# Patient Record
Sex: Female | Born: 1980 | Race: Asian | Hispanic: No | Marital: Married | Smoking: Never smoker
Health system: Southern US, Community
[De-identification: ages and names within clinical notes are randomized; demographics above are authoritative.]

## PROBLEM LIST (undated history)

## (undated) DIAGNOSIS — D649 Anemia, unspecified: Secondary | ICD-10-CM

## (undated) DIAGNOSIS — R011 Cardiac murmur, unspecified: Secondary | ICD-10-CM

## (undated) DIAGNOSIS — O24419 Gestational diabetes mellitus in pregnancy, unspecified control: Secondary | ICD-10-CM

## (undated) HISTORY — DX: Anemia, unspecified: D64.9

## (undated) HISTORY — DX: Cardiac murmur, unspecified: R01.1

## (undated) HISTORY — DX: Gestational diabetes mellitus in pregnancy, unspecified control: O24.419

---

## 2012-11-09 HISTORY — PX: HYSTEROSCOPY: SHX211

## 2013-08-02 ENCOUNTER — Institutional Professional Consult (permissible substitution): Payer: Self-pay | Admitting: Cardiovascular Disease

## 2013-08-08 ENCOUNTER — Encounter: Payer: Self-pay | Admitting: Cardiovascular Disease

## 2016-05-07 ENCOUNTER — Ambulatory Visit (INDEPENDENT_AMBULATORY_CARE_PROVIDER_SITE_OTHER): Payer: BLUE CROSS/BLUE SHIELD | Admitting: Physician Assistant

## 2016-05-07 ENCOUNTER — Ambulatory Visit (INDEPENDENT_AMBULATORY_CARE_PROVIDER_SITE_OTHER): Payer: BLUE CROSS/BLUE SHIELD

## 2016-05-07 VITALS — BP 106/74 | HR 82 | Temp 98.2°F | Resp 16 | Ht 63.5 in | Wt 154.6 lb

## 2016-05-07 DIAGNOSIS — S6991XA Unspecified injury of right wrist, hand and finger(s), initial encounter: Secondary | ICD-10-CM

## 2016-05-07 DIAGNOSIS — S60011A Contusion of right thumb without damage to nail, initial encounter: Secondary | ICD-10-CM | POA: Diagnosis not present

## 2016-05-07 NOTE — Patient Instructions (Addendum)
Ibuprofen, ice, rest Wear splint for next 1 week. May take off at night if you want. If your symptoms are not improving in 1 week, return for repeat xray.    IF you received an x-ray today, you will receive an invoice from [facility]. Please contact [facility] at [phone] with questions or concerns regarding your invoice.   IF you received labwork today, you will receive an invoice from [facility]. Please contact [facility] at [phone] with questions or concerns regarding your invoice.   Our billing staff will not be able to assist you with questions regarding bills from these companies.  You will be contacted with the lab results as soon as they are available. The fastest way to get your results is to activate your My Chart account. Instructions are located on the last page of this paperwork. If you have not heard from us regarding the results in 2 weeks, please contact this office.

## 2016-05-07 NOTE — Progress Notes (Signed)
[facility] [address], [place] [address] [phone]  Date:  05/07/2016   Name:  Crystal Boyd   DOB:  07-22-1981   MRN:  [id number]  PCP:  No primary care provider on file.    Chief Complaint: Hand Injury   History of Present Illness:  This is a 35 y.o. female who is presenting with an injury to her right hand that occurred 1 day ago. She was walking her dog who is about 150 pounds. She had her right hand through the hand loop on the leash. Her dog wrapped the leash around a metal pole and the pole made a sound that scared him and he ran. This pulled her and her right thumb smashed into the pole. She is having some pinky soreness as well but not too bad. Thumb is swollen and bruised. Some tingling in her thumb. She wrapped her hand last night and applied ice. This morning when she woke still very bruised and swollen.  She is right hand dominant.  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  Review of Systems:  Review of Systems See HPI  There are no active problems to display for this patient.   Prior to Admission medications   Not on File    No Known Allergies  No past surgical history on file.  Social History  Substance Use Topics  . Smoking status: Never Smoker   . Smokeless tobacco: None  . Alcohol Use: No    Family History  Problem Relation Age of Onset  . Hypertension Mother   . Stroke Mother     Medication list has been reviewed and updated.  Physical Examination:  Physical Exam  Constitutional: She is oriented to person, place, and time. She appears well-developed and well-nourished. No distress.  HENT:  Head: Normocephalic and atraumatic.  Right Ear: Hearing normal.  Left Ear: Hearing normal.  Nose: Nose normal.  Eyes: Conjunctivae and lids are normal. Right eye exhibits no discharge. Left eye exhibits no discharge. No scleral icterus.  Pulmonary/Chest: Effort normal. No respiratory distress.  Musculoskeletal:       Right  wrist: Normal.       Left wrist: Normal.       Right hand: She exhibits decreased range of motion (decreased rom of thumb in all directions), tenderness (most tenderness over MCP joint), bony tenderness and swelling. She exhibits normal capillary refill. Normal sensation noted. Decreased strength (thumb) noted.       Left hand: Normal.  Mild tenderness over MCP 5th digit. No swelling or bruising. Full ROM and full strength.  Neurological: She is alert and oriented to person, place, and time.  Skin: Skin is warm, dry and intact.  Psychiatric: She has a normal mood and affect. Her speech is normal and behavior is normal. Thought content normal.    BP 106/74 mmHg  Pulse 82  Temp(Src) 98.2 F (36.8 C) (Oral)  Resp 16  Ht 5' 3.5" (1.613 m)  Wt 154 lb 9.6 oz (70.126 kg)  BMI 26.95 kg/m2  SpO2 100%  LMP 05/04/2016  Dg Finger Thumb Right  05/07/2016  CLINICAL DATA:  Thumb injury yesterday with swelling and bruising centered over the metacarpophalangeal joint EXAM: RIGHT THUMB 2+V COMPARISON:  None in PACs FINDINGS: The bones of the right thumb are adequately mineralized. There is no acute fracture nor dislocation. The joint spaces are preserved. There is mild soft tissue swelling centered over the first MCP joint. The first [facility]  joint is unremarkable. IMPRESSION: There is no acute bony abnormality of the right thumb. Electronically Signed   By: David  Swazil[place]Jordan M.D.   On: 05/07/2016 14:44    Assessment and Plan:  1. Thumb injury, right, initial encounter 2. Thumb contusion, right, initial encounter Radiograph negative. Placed in thumb spica splint for 1 week. Counseled on RICE therapy. Return in 1 week if symptoms not improving. - DG Finger Thumb Right; Future    Roswell MinersNicole V. Dyke BrackettBush, PA-C, MHS [facility]  05/07/2016

## 2016-08-26 ENCOUNTER — Other Ambulatory Visit (HOSPITAL_COMMUNITY): Payer: Self-pay | Admitting: Obstetrics

## 2016-08-26 DIAGNOSIS — Z3149 Encounter for other procreative investigation and testing: Secondary | ICD-10-CM

## 2016-08-31 ENCOUNTER — Ambulatory Visit (HOSPITAL_COMMUNITY)
Admission: RE | Admit: 2016-08-31 | Discharge: 2016-08-31 | Disposition: A | Discharge status: Home / Self Care | Payer: BLUE CROSS/BLUE SHIELD | Source: Ambulatory Visit | Attending: Obstetrics | Admitting: Obstetrics

## 2016-08-31 ENCOUNTER — Encounter (INDEPENDENT_AMBULATORY_CARE_PROVIDER_SITE_OTHER): Payer: Self-pay

## 2016-08-31 DIAGNOSIS — N979 Female infertility, unspecified: Secondary | ICD-10-CM | POA: Insufficient documentation

## 2016-08-31 DIAGNOSIS — Z3149 Encounter for other procreative investigation and testing: Secondary | ICD-10-CM

## 2016-08-31 MED ORDER — IOPAMIDOL (ISOVUE-300) INJECTION 61%
30.0000 mL | Freq: Once | INTRAVENOUS | Status: AC | PRN
  Administered 2016-08-31: 17 mL

## 2016-08-31 MED ORDER — IOPAMIDOL (ISOVUE-300) INJECTION 61%: 30.0000 mL | Freq: Once | INTRAVENOUS | Status: DC | PRN

## 2018-11-09 HISTORY — PX: POLYPECTOMY: SHX149

## 2019-11-01 ENCOUNTER — Other Ambulatory Visit: Payer: Self-pay | Admitting: Obstetrics

## 2021-05-27 ENCOUNTER — Other Ambulatory Visit: Payer: Self-pay

## 2021-05-27 ENCOUNTER — Ambulatory Visit (INDEPENDENT_AMBULATORY_CARE_PROVIDER_SITE_OTHER): Payer: Self-pay | Admitting: Family Medicine

## 2021-05-27 ENCOUNTER — Encounter: Payer: Self-pay | Admitting: Family Medicine

## 2021-05-27 VITALS — BP 114/75 | HR 79 | Wt 162.9 lb

## 2021-05-27 DIAGNOSIS — Z3200 Encounter for pregnancy test, result unknown: Secondary | ICD-10-CM

## 2021-05-27 DIAGNOSIS — Z3490 Encounter for supervision of normal pregnancy, unspecified, unspecified trimester: Secondary | ICD-10-CM

## 2021-05-27 LAB — POCT PREGNANCY, URINE: Preg Test, Ur: POSITIVE — AB

## 2021-05-27 MED ORDER — PRENATAL 6.75-0.2 MG PO TABS: 1.0000 | ORAL_TABLET | Freq: Every day | ORAL | 11 refills | Status: DC

## 2021-05-27 NOTE — Patient Instructions (Addendum)

## 2021-05-27 NOTE — Progress Notes (Signed)
  History:  [name] is a 40 y.o. [phone] who presents to clinic today with complaint of possible pregnancy.    Past Medical History:  Diagnosis Date   Anemia    Heart murmur     History reviewed. No pertinent surgical history.  The following portions of the patient's history were reviewed and updated as appropriate: allergies, current medications, past family history, past medical history, past social history, past surgical history and problem list.   Review of Systems:  Pertinent items noted in HPI and remainder of comprehensive ROS otherwise negative.  Objective:  Physical Exam BP 114/75   Pulse 79   Wt 162 lb 14.4 oz (73.9 kg)   LMP 04/01/2021   BMI 28.40 kg/m  Physical Exam Vitals reviewed.  Constitutional:      General: She is not in acute distress.    Appearance: She is well-developed. She is not diaphoretic.  Eyes:     General: No scleral icterus. Pulmonary:     Effort: Pulmonary effort is normal. No respiratory distress.  Abdominal:     General: There is no distension.     Palpations: Abdomen is soft.     Tenderness: There is no abdominal tenderness. There is no guarding or rebound.  Skin:    General: Skin is warm and dry.  Neurological:     Mental Status: She is alert.     Coordination: Coordination normal.     Labs and Imaging Results for orders placed or performed in visit on 05/27/21 (from the past 24 hour(s))  Pregnancy, urine POC     Status: Abnormal   Collection Time: 05/27/21 11:31 AM  Result Value Ref Range   Preg Test, Ur POSITIVE (A) NEGATIVE    No results found.   Assessment & Plan:  1. Early stage of pregnancy Unplanned but welcome pregnancy. Bedside US shows live IUP with normal cardiac activity.  No other medical conditions. Will obtain ROI for [facility] records. To be scheduled for new OB visit.  - Prenatal 6.75-0.2 MG TABS; Take 1 tablet by mouth daily.  Dispense: 30 tablet; Refill: 11    Approximately 15  minutes of total time was spent with this patient on history taking, coordination of care, education and documentation.   Venora Maples, MD 05/27/2021 4:40 PM

## 2021-05-27 NOTE — Progress Notes (Signed)
Possible Pregnancy  Here today for pregnancy confirmation. UPT in office today is positive. Pt reports first positive home UPT 2 weeks ago. Pt expressed thinking she was going through menopause, but decided to take a pregnancy test to be sure. This was an unplanned pregnancy, pt not on birth control prior. Reviewed dating with patient:   LMP: 04/01/21 EDD: 11/11/21 8w 0d today  OB history reviewed and updated. Reports one living child; full term vaginal delivery in 2003. Reports 2 miscarriages. Most recent miscarriage in 2019 required surgical intervention, pt cannot recall procedure type. Prior OB/GYN care received at [facility]; pt is changing practices due to change in insurance coverage. ROI to be signed prior to patient leaving the office today. Reviewed medications and allergies with patient; list of medications safe to take during pregnancy given in AVS. Encouraged pt to go to MAU for any emergent concerns; map provided in AVS. Prenatal vitamin ordered. Pt will schedule new OB appts with front office prior to leaving.   Crissie Reese, MD to bedside to welcome patient to our practice and complete brief assessment.   Marjo Bicker, RN 05/27/2021  12:01 PM

## 2021-06-04 ENCOUNTER — Encounter (HOSPITAL_COMMUNITY): Payer: Self-pay | Admitting: Obstetrics and Gynecology

## 2021-06-04 ENCOUNTER — Inpatient Hospital Stay (HOSPITAL_COMMUNITY): Payer: Medicaid Other

## 2021-06-04 ENCOUNTER — Other Ambulatory Visit: Payer: Self-pay

## 2021-06-04 ENCOUNTER — Telehealth: Payer: Self-pay

## 2021-06-04 ENCOUNTER — Inpatient Hospital Stay (HOSPITAL_COMMUNITY)
Admission: AD | Admit: 2021-06-04 | Discharge: 2021-06-04 | Disposition: A | Discharge status: Home / Self Care | Payer: Medicaid Other | Attending: Obstetrics and Gynecology | Admitting: Obstetrics and Gynecology

## 2021-06-04 DIAGNOSIS — O468X1 Other antepartum hemorrhage, first trimester: Secondary | ICD-10-CM

## 2021-06-04 DIAGNOSIS — O09521 Supervision of elderly multigravida, first trimester: Secondary | ICD-10-CM | POA: Diagnosis not present

## 2021-06-04 DIAGNOSIS — O209 Hemorrhage in early pregnancy, unspecified: Secondary | ICD-10-CM

## 2021-06-04 DIAGNOSIS — N939 Abnormal uterine and vaginal bleeding, unspecified: Secondary | ICD-10-CM | POA: Diagnosis not present

## 2021-06-04 DIAGNOSIS — O418X10 Other specified disorders of amniotic fluid and membranes, first trimester, not applicable or unspecified: Secondary | ICD-10-CM | POA: Insufficient documentation

## 2021-06-04 DIAGNOSIS — Z3A09 9 weeks gestation of pregnancy: Secondary | ICD-10-CM | POA: Insufficient documentation

## 2021-06-04 DIAGNOSIS — Z3491 Encounter for supervision of normal pregnancy, unspecified, first trimester: Secondary | ICD-10-CM

## 2021-06-04 DIAGNOSIS — O208 Other hemorrhage in early pregnancy: Secondary | ICD-10-CM | POA: Diagnosis not present

## 2021-06-04 DIAGNOSIS — O200 Threatened abortion: Secondary | ICD-10-CM | POA: Diagnosis not present

## 2021-06-04 LAB — URINALYSIS, ROUTINE W REFLEX MICROSCOPIC
Bacteria, UA: NONE SEEN
Bilirubin Urine: NEGATIVE
Glucose, UA: NEGATIVE mg/dL
Ketones, ur: NEGATIVE mg/dL
Leukocytes,Ua: NEGATIVE
Nitrite: NEGATIVE
Protein, ur: NEGATIVE mg/dL
RBC / HPF: >50 RBC/hpf — ABNORMAL HIGH (ref 0–5)
Specific Gravity, Urine: 1.023 (ref 1.005–1.030)
pH: 6 (ref 5.0–8.0)

## 2021-06-04 LAB — WET PREP, GENITAL
Sperm: NONE SEEN
Trich, Wet Prep: NONE SEEN
Yeast Wet Prep HPF POC: NONE SEEN

## 2021-06-04 LAB — CBC
HCT: 33.4 % — ABNORMAL LOW (ref 36.0–46.0)
Hemoglobin: 11.2 g/dL — ABNORMAL LOW (ref 12.0–15.0)
MCH: 27.3 pg (ref 26.0–34.0)
MCHC: 33.5 g/dL (ref 30.0–36.0)
MCV: 81.5 fL (ref 80.0–100.0)
Platelets: 299 10*3/uL (ref 150–400)
RBC: 4.1 MIL/uL (ref 3.87–5.11)
RDW: 13.8 % (ref 11.5–15.5)
WBC: 13.3 10*3/uL — ABNORMAL HIGH (ref 4.0–10.5)
nRBC: 0 % (ref 0.0–0.2)

## 2021-06-04 LAB — HCG, QUANTITATIVE, PREGNANCY: hCG, Beta Chain, Quant, S: 125635 m[IU]/mL — ABNORMAL HIGH (ref <5)

## 2021-06-04 NOTE — Telephone Encounter (Signed)
Pt called Nurse VM today at 10:53  about going to the bathroom and seeing a lot of blood, no answer, left VM for call back.

## 2021-06-04 NOTE — MAU Note (Signed)
Pt reports that this morning her stomach was hurting she thought she needed to go to the bathroom when she was in there she said she pushed and blood started coming out.

## 2021-06-04 NOTE — MAU Provider Note (Addendum)
History     CSN: [id number]  Arrival date and time: 06/04/21 1709   None     Chief Complaint  Patient presents with   Vaginal Bleeding   HPI [name] is a 40 yo G4P1021 at [redacted]w[redacted]d presenting with vaginal bleeding. She was last seen at [facility] on 7/19 and a IUP with normal cardiac activity was seen. This morning, she thought she was having a bowel movement but instead she noticed a large amount of vaginal bleeding. The bleeding has since slowed down and is only observable when she wipes. She endorsed passing a couple clots. She is concerned she is having another miscarriage and says this is similar to what happened during her first miscarriage. She has had some intermittent nausea which has been occurring for the past 3 weeks but denies fever, vomiting, dysuria, or abnormal vaginal discharge.   OB History     Gravida  4   Para  1   Term  1   Preterm  0   AB  2   Living  1      SAB  2   IAB  0   Ectopic  0   Multiple  0   Live Births  1           Past Medical History:  Diagnosis Date   Anemia    Heart murmur     History reviewed. No pertinent surgical history.  Family History  Problem Relation Age of Onset   Hypertension Mother    Stroke Mother     Social History   Tobacco Use   Smoking status: Never   Smokeless tobacco: Never  Vaping Use   Vaping Use: Never used  Substance Use Topics   Alcohol use: No    Alcohol/week: 0.0 standard drinks   Drug use: No    Allergies:  Allergies  Allergen Reactions   Penicillins Shortness Of Breath    Medications Prior to Admission  Medication Sig Dispense Refill Last Dose   acetaminophen (TYLENOL) 500 MG tablet Take 500 mg by mouth every 6 (six) hours as needed.   06/04/2021   Prenatal 6.75-0.2 MG TABS Take 1 tablet by mouth daily. 30 tablet 11 06/03/2021   Prenatal Vit-Fe Fumarate-FA (PRENATAL PO) Take by mouth.       Review of Systems  Constitutional:  Positive for fatigue. Negative for fever.   Respiratory:  Negative for shortness of breath.   Cardiovascular:  Negative for chest pain.  Gastrointestinal:  Positive for abdominal pain (mild lower abdominal cramping) and nausea. Negative for constipation, diarrhea and vomiting.  Genitourinary:  Positive for vaginal bleeding. Negative for dysuria, frequency, urgency and vaginal discharge.  Neurological:  Negative for dizziness, light-headedness and headaches.   Physical Exam   Blood pressure 125/85, pulse 96, temperature 99.3 F (37.4 C), temperature source Oral, resp. rate 12, last menstrual period 04/01/2021, SpO2 99 %.  Physical Exam Exam conducted with a chaperone present.  Constitutional:      Appearance: Normal appearance.  HENT:     Head: Normocephalic and atraumatic.  Eyes:     General: No scleral icterus. Cardiovascular:     Rate and Rhythm: Normal rate and regular rhythm.     Heart sounds: Normal heart sounds.  Pulmonary:     Effort: Pulmonary effort is normal.     Breath sounds: Normal breath sounds.  Abdominal:     General: Bowel sounds are normal.     Palpations: Abdomen is soft.  Tenderness: There is abdominal tenderness (patient rated 3/10) in the right upper quadrant. There is no right CVA tenderness or left CVA tenderness. Negative signs include Murphy's sign.  Genitourinary:    Cervix: Normal.     Comments: Blood in the vaginal vault removed with 2 Fox swabs.  Skin:    Coloration: Skin is not jaundiced.  Neurological:     Mental Status: She is alert and oriented to person, place, and time.  Psychiatric:        Mood and Affect: Mood normal.        Behavior: Behavior normal.    Imaging:  US OB Comp Less 14 Wks  Result Date: 06/04/2021 CLINICAL DATA:  Vaginal bleeding. EXAM: OBSTETRIC <14 WK ULTRASOUND TECHNIQUE: Transabdominal ultrasound was performed for evaluation of the gestation as well as the maternal uterus and adnexal regions. COMPARISON:  None. FINDINGS: Intrauterine gestational sac:  Single Yolk sac:  Visualized. Embryo:  Visualized. Cardiac Activity: Visualized. Heart Rate: 173 bpm CRL:   28.9 mm   9 w 4 d                  [place] EDC: January 03, 2022 Subchorionic hemorrhage: Moderate in size, to the right of the gestational sac. Maternal uterus/adnexae: A corpus luteum cyst is seen within an otherwise normal appearing right ovary. The left ovary is visualized and is normal in appearance. No pelvic free fluid is identified. IMPRESSION: 1. Single, viable intrauterine pregnancy at approximately 9 weeks and 4 days gestation by ultrasound evaluation. 2. Moderate sized subchorionic hemorrhage. Electronically Signed   By: Aram Candela M.D.   On: 06/04/2021 19:23     MAU Course  Procedures  MDM - CBC - UA  - hCG, quantitative  - ABO/Rh  - GC/Chlamydia  - Wet prep   Assessment and Plan  Intrauterine pregnancy  - [place] from today revealed a single, viable intrauterine pregnancy  Threatened Miscarriage  - Blood removed from the vaginal vault during speculum exam with 2 Fox swabs  - Patient is AB positive, Rhogam is not indicated   [redacted] weeks gestation of pregnancy   Clue cells positive on wet prep  - Patient denies vaginal discharge, will not treat at this time   5.   Subchorionic hemorrhage  - US revealed a moderate in size subchorionic hemorrhage to the right of the gestational sac  - Counseled patient that a small amount of bleeding can be normal but she should come back to the MAU if she has significant bleeding and/or pain again  Disposition: Patient is discharged home in stable condition and given precautions to return. She should follow up in the office for her scheduled prenatal appointments.   [name] 06/04/2021, 5:56 PM   CNM attestation:  I have seen and examined this patient and agree with above documentation in the PA student's note.   Crystal Boyd is a 40 y.o. O2V0350 at [redacted]w[redacted]d reporting an episode of heavy bleed +FM, denies LOF, VB, contractions,  vaginal discharge.  PE: Patient Vitals for the past 24 hrs:  BP Temp Temp src Pulse Resp SpO2  06/04/21 1731 125/85 99.3 F (37.4 C) Oral 96 12 99 %   Gen: calm comfortable, NAD Resp: normal effort, no distress Heart: Regular rate Abd: Soft, NT, gravid, S=D  FHR:   ROS, labs, PMH reviewed  Orders Placed This Encounter  Procedures   Wet prep, genital   US OB Comp Less 14 Wks   Urinalysis, Routine w reflex microscopic  Urine, Clean Catch   CBC   hCG, quantitative, pregnancy   ABO/Rh   Discharge patient   No orders of the defined types were placed in this encounter.   MDM Pt stable with Hgb 11, [place] today confirms viable IUP with moderate subchorionic hemorrhage.  Bleeding precautions reviewed, follow up at scheduled appointments at [facility]. Return to MAU as needed for emergencies.  Assessment: 1. Subchorionic hemorrhage of placenta in first trimester, single or unspecified fetus   2. Vaginal bleeding in pregnancy, first trimester   3. Normal IUP (intrauterine pregnancy) on prenatal ultrasound, first trimester   4. [redacted] weeks gestation of pregnancy   5. Threatened miscarriage in early pregnancy     Plan: - Discharge home in stable condition.  Kendell Bane 06/04/2021 7:48 PM

## 2021-06-04 NOTE — Telephone Encounter (Signed)
Patient called into after hours line stating she thinks she had a miscarriage. Patient states she went to bathroom this morning because she felt urge to have a bowel movement and noticed a lot of blood, more than period came out. Patient reports feeling something slimy with wiping but didn't look. She states she is having light bleeding currently with mild cramping. Advised patient to go to MAU for evaluation. Patient verbalized understanding.

## 2021-06-05 LAB — ABO/RH: ABO/RH(D): AB POS

## 2021-06-05 LAB — GC/CHLAMYDIA PROBE AMP (~~LOC~~) NOT AT ARMC
Chlamydia: NEGATIVE
Comment: NEGATIVE
Comment: NORMAL
Neisseria Gonorrhea: NEGATIVE

## 2021-06-11 ENCOUNTER — Other Ambulatory Visit: Payer: Self-pay

## 2021-06-11 ENCOUNTER — Telehealth (INDEPENDENT_AMBULATORY_CARE_PROVIDER_SITE_OTHER): Payer: Medicaid Other | Admitting: *Deleted

## 2021-06-11 DIAGNOSIS — O418X90 Other specified disorders of amniotic fluid and membranes, unspecified trimester, not applicable or unspecified: Secondary | ICD-10-CM

## 2021-06-11 DIAGNOSIS — O468X9 Other antepartum hemorrhage, unspecified trimester: Secondary | ICD-10-CM | POA: Insufficient documentation

## 2021-06-11 DIAGNOSIS — Z3490 Encounter for supervision of normal pregnancy, unspecified, unspecified trimester: Secondary | ICD-10-CM

## 2021-06-11 DIAGNOSIS — R011 Cardiac murmur, unspecified: Secondary | ICD-10-CM | POA: Insufficient documentation

## 2021-06-11 DIAGNOSIS — O09529 Supervision of elderly multigravida, unspecified trimester: Secondary | ICD-10-CM

## 2021-06-11 DIAGNOSIS — O0990 Supervision of high risk pregnancy, unspecified, unspecified trimester: Secondary | ICD-10-CM

## 2021-06-11 DIAGNOSIS — Z3A00 Weeks of gestation of pregnancy not specified: Secondary | ICD-10-CM

## 2021-06-11 MED ORDER — BLOOD PRESSURE KIT DEVI
1.0000 | 0 refills | Status: AC | PRN
Start: 2021-06-11 — End: ?

## 2021-06-11 NOTE — Progress Notes (Addendum)
11:11 Patient not in Virtual visit. I called and asked her to join after verifying 2 identifiers. I gave her instructions on how to join virtual visit. Crystal Croft,RN  New OB Intake  I connected with  Crystal Boyd on 06/11/21 at 11:15 AM EDT by MyChart Video Visit and verified that I am speaking with the correct person using two identifiers. Nurse is located at [facility] and pt is located at home.  I discussed the limitations, risks, security and privacy concerns of performing an evaluation and management service by telephone and the availability of in person appointments. I also discussed with the patient that there may be a patient responsible charge related to this service. The patient expressed understanding and agreed to proceed.  I explained I am completing New OB Intake today. We discussed her EDD of 01/06/22 that is based on LMP of 04/01/21. Pt is G4/P1021. I reviewed her allergies, medications, Medical/Surgical/OB history, and appropriate screenings. I informed her of [facility] services. Based on history, this is a/an  pregnancy complicated by AMA and moderate subchorionic hemorrhage  .   Patient states spotted 1-2 days after MAU but no bleeding since then. She has hx 2 SAB and a hysteroscopy and or D&C and /or polypectomy in past. She has signed release for records from [facility].   Patient Active Problem List   Diagnosis Date Noted   Supervision of high risk pregnancy, antepartum 06/11/2021   Subchorionic hemorrhage, antepartum 06/11/2021   Heart murmur    AMA (advanced maternal age) multigravida 35+     Concerns addressed today  Delivery Plans:  Plans to deliver at [facility].   MyChart/Babyscripts MyChart access verified. I explained pt will have some visits in office and some virtually. Babyscripts instructions given and order placed.   Blood Pressure Cuff  Blood pressure cuff ordered for patient to pick-up from [employer]. Explained after first prenatal appt pt will check  weekly and document in Babyscripts.  Weight scale: Patient does have weight scale.   Anatomy US Explained first scheduled [place] will be around 19 weeks. Anatomy US scheduled for 08/12/21 at 1045. Pt notified to arrive at 1030.  Labs Discussed Avelina Laine genetic screening with patient. Would like both Panorama and Horizon drawn at new OB visit. Routine prenatal labs needed.  Covid Vaccine Patient has had covid vaccine. She will bring her vaccination card to first ob appointment so her records can be updated.   Mother/ Baby Dyad Candidate?   No If yes, offer as possibility  Informed patient of [facility] website  and placed link in her AVS.   Social Determinants of Health Food Insecurity: Patient denies food insecurity. WIC Referral: Patient is not interested in referral to [facility].  Transportation: Patient denies transportation needs. Childcare: Discussed no children allowed at ultrasound appointments. Offered childcare services; patient declines childcare services at this time.   Placed OB Box on problem list and updated  First visit review I reviewed new OB appt with pt. I explained she will have a pelvic exam, ob bloodwork with genetic screening, and PAP smear. Explained pt will be seen by Nolene Bernheim, NP  at first visit; encounter routed to appropriate provider. Explained that patient will be seen by pregnancy navigator following visit with provider. [facility] information placed in AVS.   Crystal Fahrner,RN 06/11/2021  12:05 PM    Chart reviewed for nurse visit. Agree with plan of care.   Currie Paris, NP 06/11/2021 12:34 PM

## 2021-06-11 NOTE — Patient Instructions (Addendum)
  At our [facility], we work as an integrated team, providing care to address both physical and emotional health. Your medical provider may refer you to see our Behavioral Health Clinician (BHC) on the same day you see your medical provider, as availability permits; often scheduled virtually at your convenience.  Our BHC is available to all patients, visits generally last between 20-30 minutes, but can be longer or shorter, depending on patient need. The BHC offers help with stress management, coping with symptoms of depression and anxiety, major life changes , sleep issues, changing risky behavior, grief and loss, life stress, working on personal life goals, and  behavioral health issues, as these all affect your overall health and wellness.  The BHC is NOT available for the following: FMLA paperwork, court-ordered evaluations, specialty assessments (custody or disability), letters to employers, or obtaining certification for an emotional support animal. The BHC does not provide long-term therapy. You have the right to refuse integrated behavioral health services, or to reschedule to see the BHC at a later date.  Confidentiality exception: If it is suspected that a child or disabled adult is being abused or neglected, we are required by law to report that to either Child Protective Services or Adult Protective Services.  If you have a diagnosis of Bipolar affective disorder, Schizophrenia, or recurrent Major depressive disorder, we will recommend that you establish care with a psychiatrist, as these are lifelong, chronic conditions, and we want your overall emotional health and medications to be more closely monitored. If you anticipate needing extended maternity leave due to mental health issues postpartum, it it recommended you inform your medical provider, so we can put in a referral to a psychiatrist as soon as possible. The BHC is unable to recommend an extended maternity leave for mental  health issues. Your medical provider or BHC may refer you to a therapist for ongoing, traditional therapy, or to a psychiatrist, for medication management, if it would benefit your overall health. Depending on your insurance, you may have a copay or be charged a deductible, depending on your insurance, to see the BHC. If you are uninsured, it is recommended that you apply for financial assistance. (Forms may be requested at the front desk for in-person visits, via MyChart, or request a form during a virtual visit).  If you see the BHC more than 6 times, you will have to complete a comprehensive clinical assessment interview with the BHC to resume integrated services.  For virtual visits with the BHC, you must be physically in the state of [place] at the time of the visit. For example, if you live in [place], you will have to do an in-person visit with the BHC, and your out-of-state insurance may not cover behavioral health services in [place]. If you are going out of the state or country for any reason, the BHC may see you virtually when you return to [place], but not while you are physically outside of [place].      [email or web address] is a great resource  

## 2021-06-12 ENCOUNTER — Encounter: Payer: Self-pay | Admitting: *Deleted

## 2021-06-18 ENCOUNTER — Other Ambulatory Visit (HOSPITAL_COMMUNITY)
Admission: RE | Admit: 2021-06-18 | Discharge: 2021-06-18 | Disposition: A | Discharge status: Home / Self Care | Payer: Medicaid Other | Source: Ambulatory Visit | Attending: Nurse Practitioner | Admitting: Nurse Practitioner

## 2021-06-18 ENCOUNTER — Ambulatory Visit (INDEPENDENT_AMBULATORY_CARE_PROVIDER_SITE_OTHER): Payer: Medicaid Other | Admitting: Nurse Practitioner

## 2021-06-18 ENCOUNTER — Encounter: Payer: Self-pay | Admitting: Nurse Practitioner

## 2021-06-18 ENCOUNTER — Other Ambulatory Visit: Payer: Self-pay

## 2021-06-18 VITALS — BP 123/85 | HR 101

## 2021-06-18 DIAGNOSIS — Z3A11 11 weeks gestation of pregnancy: Secondary | ICD-10-CM

## 2021-06-18 DIAGNOSIS — Z3490 Encounter for supervision of normal pregnancy, unspecified, unspecified trimester: Secondary | ICD-10-CM

## 2021-06-18 DIAGNOSIS — O09291 Supervision of pregnancy with other poor reproductive or obstetric history, first trimester: Secondary | ICD-10-CM

## 2021-06-18 DIAGNOSIS — O468X9 Other antepartum hemorrhage, unspecified trimester: Secondary | ICD-10-CM | POA: Diagnosis not present

## 2021-06-18 DIAGNOSIS — O09529 Supervision of elderly multigravida, unspecified trimester: Secondary | ICD-10-CM | POA: Diagnosis not present

## 2021-06-18 DIAGNOSIS — O0990 Supervision of high risk pregnancy, unspecified, unspecified trimester: Secondary | ICD-10-CM | POA: Diagnosis not present

## 2021-06-18 DIAGNOSIS — O418X90 Other specified disorders of amniotic fluid and membranes, unspecified trimester, not applicable or unspecified: Secondary | ICD-10-CM

## 2021-06-18 NOTE — Progress Notes (Signed)
Subjective:   Crystal Boyd is a 40 y.o. [id number] at 34w1dby LMP, early ultrasound being seen today for her first obstetrical visit.  Her obstetrical history is significant for advanced maternal age and previous pregnancy losses in first trimester and bleeding from subchorionic hemorrhage . Patient  is unsure if she  intends to breast feed. Pregnancy history fully reviewed.  She is very anxious today as she has had slight pink seen on the toilet tissue when wiping for 3 days.  No abdominal pain.  No severe bleeding.  FHT heard by doppler but patient thought it sounded far away and with her nausea abating, she is worried about a miscarriage this time.  Her child is 164years old.  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  Patient reports  pink color on tissue when wiping for 3 days .  HISTORY: OB History  Gravida Para Term Preterm AB Living  '4 1 1 ' 0 2 1  SAB IAB Ectopic Multiple Live Births  2 0 0 0 1    # Outcome Date GA Lbr Len/2nd Weight Sex Delivery Anes PTL Lv  4 Current           3 SAB 2019 [id number]          Birth Comments: had D&C at 10-11 weeks  2 SAB 2014             Birth Comments: not sure if had D&C  1 Term 11/11/01   8 lb 2 oz (3.685 kg)  Vag-Spont EPI, Other  LIV     Birth Comments: wnl   Past Medical History:  Diagnosis Date   Anemia    Heart murmur    Past Surgical History:  Procedure Laterality Date   HYSTEROSCOPY  2014   thinks had few months after miscarriage   POLYPECTOMY  2020   removed polyp in uterus-[facility]   Family History  Problem Relation Age of Onset   Kidney disease Mother    Hypertension Mother    Stroke Mother    Social History   Tobacco Use   Smoking status: Never   Smokeless tobacco: Never  Vaping Use   Vaping Use: Never used  Substance Use Topics   Alcohol use: No    Alcohol/week: 0.0 standard drinks   Drug use: No   Allergies  Allergen Reactions   Penicillins Shortness Of Breath   Current Outpatient  Medications on File Prior to Visit  Medication Sig Dispense Refill   acetaminophen (TYLENOL) 500 MG tablet Take 500 mg by mouth every 6 (six) hours as needed.     Blood Pressure Monitoring (BLOOD PRESSURE KIT) DEVI 1 Device by Does not apply route as needed. 1 each 0   Prenatal Vit-Fe Fumarate-FA (PRENATAL PO) Take by mouth.     Prenatal 6.75-0.2 MG TABS Take 1 tablet by mouth daily. (Patient not taking: No sig reported) 30 tablet 11   No current facility-administered medications on file prior to visit.     Exam   Vitals:   06/18/21 1506  BP: 123/85  Pulse: (!) 101   Fetal Heart Rate (bpm): 168  Uterus:     Pelvic Exam: Perineum: no  inflammed hemorrhoids, normal perineum   Vulva: normal external genitalia, no lesions   Vagina:  normal mucosa, small amount of yellow discharge, no blood   Cervix: no lesions and normal, pap smear done.  Closed and thick on bimanual exam.  Slight  blood seen on pap specimen.  No blood seen in vagina   Adnexa: normal adnexa and no mass, fullness, tenderness   Bony Pelvis: average  System: General: well-developed, well-nourished female in no acute distress   Breast:  normal appearance, no masses or tenderness   Skin: normal coloration and turgor, no rashes   Neurologic: oriented, normal, negative, normal mood   Extremities: normal strength, tone, and muscle mass, ROM of all joints is normal   HEENT extraocular movement intact and sclera clear, anicteric   Mouth/Teeth Advised brushing and floosing daily and to see dentist - last seen 18 months ago for dental cleaning   Neck supple and no masses, normal thyroid   Cardiovascular: regular rate and rhythm - no murmur heard today but has had murmur detected in the past   Respiratory:  no respiratory distress, normal breath sounds   Abdomen: soft, non-tender; no masses,  no organomegaly     Assessment:   Pregnancy: [id number] Patient Active Problem List   Diagnosis Date Noted   Supervision of high risk  pregnancy, antepartum 06/11/2021   Subchorionic hemorrhage, antepartum 06/11/2021   Heart murmur    AMA (advanced maternal age) multigravida 35+      Plan:  1. Encounter for supervision of low-risk pregnancy, antepartum Continues to be worried about a miscarriage despite seeing no blood in vagina today and cervix is long and closed.  FHT heard by doppler Nause is abating and she is worried. Breasts still somewhat larger but they are no longer tender. Will see patient in 2 weeks due to her intense worry about whether this pregnancy will be healthy.  - Culture, OB Urine - Genetic Screening - Hemoglobin A1c - Cytology - PAP( [place]) - CBC/D/Plt+RPR+Rh+ABO+RubIgG... - Cervicovaginal ancillary only( [place])  2. Subchorionic hemorrhage, antepartum No severe bleeding like she had at the MAU visit.  3. Antepartum multigravida of advanced maternal age Genetic testing drawn today In reviewing criteria for low dose aspirin (UpToDate) client meets > age 85 and > 10 years since last pregnancy(with living child) Did not discuss low dose aspirin today but plan to start at next visit is 2 weeks  4. [redacted] weeks gestation of pregnancy    Initial labs drawn. Continue prenatal vitamins. Genetic Screening discussed, NIPS: ordered. Ultrasound discussed; fetal anatomic survey: ordered. Problem list reviewed and updated. The nature of [place] with multiple MDs and other Advanced Practice Providers was explained to patient; also emphasized that residents, students are part of our team. Routine obstetric precautions reviewed. Return in about 2 weeks (around 07/02/2021) for in person ROB.  Total face-to-face time with patient: 40 minutes.  Over 50% of encounter was spent on counseling and coordination of care.     Earlie Server, FNP Family Nurse Practitioner, [facility] for [employer], [place] [facility] 06/18/2021 4:52  PM

## 2021-06-18 NOTE — Progress Notes (Signed)
Patient stated that she has been having some vaginal spotting for a couple of days, denies any pain

## 2021-06-19 LAB — CERVICOVAGINAL ANCILLARY ONLY
Bacterial Vaginitis (gardnerella): POSITIVE — AB
Candida Glabrata: NEGATIVE
Candida Vaginitis: NEGATIVE
Comment: NEGATIVE
Comment: NEGATIVE
Comment: NEGATIVE
Comment: NEGATIVE
Trichomonas: NEGATIVE

## 2021-06-19 LAB — CBC/D/PLT+RPR+RH+ABO+RUBIGG...
Antibody Screen: NEGATIVE
Basophils Absolute: 0 10*3/uL (ref 0.0–0.2)
Basos: 0 %
EOS (ABSOLUTE): 0.1 10*3/uL (ref 0.0–0.4)
Eos: 0 %
HCV Ab: <0.1 s/co ratio (ref 0.0–0.9)
HIV Screen 4th Generation wRfx: NONREACTIVE
Hematocrit: 36.2 % (ref 34.0–46.6)
Hemoglobin: 11.8 g/dL (ref 11.1–15.9)
Hepatitis B Surface Ag: NEGATIVE
Immature Grans (Abs): 0 10*3/uL (ref 0.0–0.1)
Immature Granulocytes: 0 %
Lymphocytes Absolute: 2.2 10*3/uL (ref 0.7–3.1)
Lymphs: 18 %
MCH: 26.9 pg (ref 26.6–33.0)
MCHC: 32.6 g/dL (ref 31.5–35.7)
MCV: 83 fL (ref 79–97)
Monocytes Absolute: 0.7 10*3/uL (ref 0.1–0.9)
Monocytes: 6 %
Neutrophils Absolute: 9.3 10*3/uL — ABNORMAL HIGH (ref 1.4–7.0)
Neutrophils: 76 %
Platelets: 342 10*3/uL (ref 150–450)
RBC: 4.38 x10E6/uL (ref 3.77–5.28)
RDW: 13.8 % (ref 11.7–15.4)
RPR Ser Ql: NONREACTIVE
Rh Factor: POSITIVE
Rubella Antibodies, IGG: 16.3 index (ref >0.99)
WBC: 12.3 10*3/uL — ABNORMAL HIGH (ref 3.4–10.8)

## 2021-06-19 LAB — HCV INTERPRETATION

## 2021-06-19 LAB — HEMOGLOBIN A1C
Est. average glucose Bld gHb Est-mCnc: 103 mg/dL
Hgb A1c MFr Bld: 5.2 % (ref 4.8–5.6)

## 2021-06-19 MED ORDER — METRONIDAZOLE 500 MG PO TABS: 500.0000 mg | ORAL_TABLET | Freq: Two times a day (BID) | ORAL | 0 refills | Status: DC

## 2021-06-19 NOTE — Addendum Note (Signed)
Addended by: Currie Paris on: 06/19/2021 03:31 PM   Modules accepted: Orders

## 2021-06-20 LAB — URINE CULTURE, OB REFLEX

## 2021-06-20 LAB — CULTURE, OB URINE

## 2021-06-25 LAB — CYTOLOGY - PAP
Chlamydia: NEGATIVE
Comment: NEGATIVE
Comment: NEGATIVE
Comment: NORMAL
Diagnosis: NEGATIVE
Diagnosis: REACTIVE
High risk HPV: NEGATIVE
Neisseria Gonorrhea: NEGATIVE

## 2021-07-04 ENCOUNTER — Other Ambulatory Visit: Payer: Self-pay

## 2021-07-04 ENCOUNTER — Ambulatory Visit (INDEPENDENT_AMBULATORY_CARE_PROVIDER_SITE_OTHER): Payer: Medicaid Other | Admitting: Family Medicine

## 2021-07-04 VITALS — BP 115/78 | HR 106 | Wt 164.1 lb

## 2021-07-04 DIAGNOSIS — O0990 Supervision of high risk pregnancy, unspecified, unspecified trimester: Secondary | ICD-10-CM

## 2021-07-04 DIAGNOSIS — Z3A13 13 weeks gestation of pregnancy: Secondary | ICD-10-CM

## 2021-07-04 DIAGNOSIS — O09529 Supervision of elderly multigravida, unspecified trimester: Secondary | ICD-10-CM

## 2021-07-04 MED ORDER — ASPIRIN EC 81 MG PO TBEC: 81.0000 mg | DELAYED_RELEASE_TABLET | Freq: Every day | ORAL | 11 refills | Status: DC

## 2021-07-04 NOTE — Progress Notes (Signed)
   Subjective:  Crystal Boyd is a 40 y.o. [id number] at [redacted]w[redacted]d being seen today for ongoing prenatal care.  She is currently monitored for the following issues for this high-risk pregnancy and has Supervision of high risk pregnancy, antepartum; Subchorionic hemorrhage, antepartum; Heart murmur; AMA (advanced maternal age) multigravida 35+; and History of miscarriage, currently pregnant, first trimester on their problem list.  Patient reports no bleeding, no contractions, and no cramping.  Contractions: Not present. Vag. Bleeding: None.  Movement: Absent. Denies leaking of fluid.   The following portions of the patient's history were reviewed and updated as appropriate: allergies, current medications, past family history, past medical history, past social history, past surgical history and problem list. Problem list updated.  Objective:   Vitals:   07/04/21 1002  BP: 115/78  Pulse: (!) 106  Weight: 164 lb 1.6 oz (74.4 kg)    Fetal Status: Fetal Heart Rate (bpm): 161 Fundal Height: 13 cm Movement: Absent     General:  Alert, oriented and cooperative. Patient is in no acute distress.  Skin: Skin is warm and dry. No rash noted.   Cardiovascular: Normal heart rate noted  Respiratory: Normal respiratory effort, no problems with respiration noted  Abdomen: Soft, gravid, appropriate for gestational age. Pain/Pressure: Absent     Pelvic: Vag. Bleeding: None     Cervical exam deferred        Extremities: Normal range of motion.  Edema: None  Mental Status: Normal mood and affect. Normal behavior. Normal judgment and thought content.   Assessment and Plan:  Pregnancy: G4P1021 at [redacted]w[redacted]d  1. Supervision of high risk pregnancy, antepartum 2. Antepartum multigravida of advanced maternal age Patient with hx of bleeding earlier in pregnancy in setting of subchorionic hemorrhage, no bleeding since last visit. Denies any other symptoms at this time. Given age (>35) and last pregnancy >10 years ago, meets  criteria for ASA, starting today - aspirin EC 81 MG tablet; Take 1 tablet (81 mg total) by mouth daily. Swallow whole.  Dispense: 30 tablet; Refill: 11 - Anatomy US scheduled on 10/4 - answered patient's questions regarding frequency of prenatal appointments  Preterm labor symptoms and general obstetric precautions including but not limited to vaginal bleeding, contractions, leaking of fluid and fetal movement were reviewed in detail with the patient. Please refer to After Visit Summary for other counseling recommendations.  Return in about 3 weeks (around 07/25/2021) for LROB .   Warner Mccreedy, MD, MPH OB Fellow, Faculty Practice

## 2021-07-04 NOTE — Progress Notes (Signed)
Declines IBH at this time  Wynona Canes, [place]

## 2021-07-17 ENCOUNTER — Telehealth: Payer: Self-pay | Admitting: Lactation Services

## 2021-07-17 NOTE — Telephone Encounter (Signed)
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   Advised patient it is recommended that she call [facility] at [phone] to set up a Telephone Genetic Counseling Session.   Advised it is recommended that FOB be tested to see if he carries the same gene. Advised that we have kits in the office that FOB can come in to have completed or can take home for him to complete.   Mom to call with any questions or concerns as needed.

## 2021-07-18 ENCOUNTER — Encounter: Payer: Self-pay | Admitting: Nurse Practitioner

## 2021-07-18 DIAGNOSIS — D563 Thalassemia minor: Secondary | ICD-10-CM | POA: Insufficient documentation

## 2021-07-28 ENCOUNTER — Encounter: Payer: Self-pay | Admitting: General Practice

## 2021-07-29 ENCOUNTER — Ambulatory Visit (INDEPENDENT_AMBULATORY_CARE_PROVIDER_SITE_OTHER): Payer: Medicaid Other | Admitting: Medical

## 2021-07-29 ENCOUNTER — Other Ambulatory Visit: Payer: Self-pay

## 2021-07-29 ENCOUNTER — Encounter: Payer: Self-pay | Admitting: Medical

## 2021-07-29 VITALS — BP 132/88 | HR 99 | Wt 166.1 lb

## 2021-07-29 DIAGNOSIS — O09522 Supervision of elderly multigravida, second trimester: Secondary | ICD-10-CM

## 2021-07-29 DIAGNOSIS — O0990 Supervision of high risk pregnancy, unspecified, unspecified trimester: Secondary | ICD-10-CM | POA: Diagnosis not present

## 2021-07-29 DIAGNOSIS — Z3A17 17 weeks gestation of pregnancy: Secondary | ICD-10-CM

## 2021-07-29 DIAGNOSIS — D563 Thalassemia minor: Secondary | ICD-10-CM

## 2021-07-29 NOTE — Progress Notes (Signed)
   PRENATAL VISIT NOTE  Subjective:  Crystal Boyd is a 40 y.o. [id number] at 44w0dbeing seen today for ongoing prenatal care.  She is currently monitored for the following issues for this high-risk pregnancy and has Supervision of high risk pregnancy, antepartum; Subchorionic hemorrhage, antepartum; Heart murmur; AMA (advanced maternal age) multigravida 3107+ History of miscarriage, currently pregnant, first trimester; and Alpha thalassemia silent carrier on their problem list.  Patient reports no complaints.  Contractions: Not present. Vag. Bleeding: None.  Movement: Present. Denies leaking of fluid.   The following portions of the patient's history were reviewed and updated as appropriate: allergies, current medications, past family history, past medical history, past social history, past surgical history and problem list.   Objective:   Vitals:   07/29/21 1055  BP: 132/88  Pulse: 99  Weight: 166 lb 1.6 oz (75.3 kg)    Fetal Status: Fetal Heart Rate (bpm): 160   Movement: Present     General:  Alert, oriented and cooperative. Patient is in no acute distress.  Skin: Skin is warm and dry. No rash noted.   Cardiovascular: Normal heart rate noted  Respiratory: Normal respiratory effort, no problems with respiration noted  Abdomen: Soft, gravid, appropriate for gestational age.  Pain/Pressure: Present     Pelvic: Cervical exam deferred        Extremities: Normal range of motion.  Edema: None  Mental Status: Normal mood and affect. Normal behavior. Normal judgment and thought content.   Assessment and Plan:  Pregnancy: G4P1021 at [redacted]w[redacted]d. Supervision of high risk pregnancy, antepartum - Discussed peds, list given - Anatomy USKoreacheduled 10/4 - AFP, Serum, Open Spina Bifida - Desires BTL, advised of need for consent for insurance purposes around 28 weeks   2. Alpha thalassemia silent carrier - [facility] with GC [facility] Will call for partner testing kit   3. Multigravida of  advanced maternal age in second trimester - BASA - Antenatal testing per guidelines   4. [redacted] weeks gestation of pregnancy  Preterm labor symptoms and general obstetric precautions including but not limited to vaginal bleeding, contractions, leaking of fluid and fetal movement were reviewed in detail with the patient. Please refer to After Visit Summary for other counseling recommendations.   Return in about 4 weeks (around 08/26/2021) for LOB, In-Person, any provider.  Future Appointments  Date Time [facility]:30 [facility]2021 10:45 AM [facility] US5 [facility]    JuKerry HoughPA-C

## 2021-07-29 NOTE — Patient Instructions (Addendum)
AREA PEDIATRIC/FAMILY PRACTICE PHYSICIANS  Central/[place]outheast [place] ([address]) [facility] Chambliss, MD; Eniola, MD; Hale, MD; Hensel, MD; McDiarmid, MD; McIntyer, MD; Neal, MD; Walden, MD [address] [place], [place] [address] [phone] Mon-Fri 8:30-12:30, 1:30-5:00 Providers come to see babies at [facility] Accepting Medicaid [facility] providers who accept newborns: Koirala, MD; Morrow, MD; Wolters, MD [address], [place], [place] [address] [phone] Mon-Fri 8:00-5:30 Babies seen by providers at [facility] Does NOT accept Medicaid Please call early in hospitalization for appointment (limited availability)  [facility] Mulberry, MD [address] [place], [place] [address] [phone] Mon, Tue, Thur, Fri 8:30-5:00, Wed 10:00-7:00 (closed 1-2pm) Babies seen by [facility] providers Accepting Medicaid Rubin - Pediatrician Rubin, MD [address], [place], [place] [address] [phone] Mon-Fri 8:30-5:00, Sat 8:30-12:00 Provider comes to see babies at [facility] Accepting Medicaid Must have been referred from current patients or contacted office prior to delivery [facility] ([facility]) Brown, MD; Chandler, MD; Ettefagh, MD; Grant, MD; Lester, MD; McCormick, MD; McQueen, MD; Prose, MD; Simha, MD; Stanley, MD; Stryffeler, NP; Tebben, NP [address], [place], [place] [address] [phone] Mon, Tue, Thur, Fri 8:30-5:30, Wed 9:30-5:30, Sat 8:30-12:30 Babies seen by [facility] providers Accepting Medicaid Only accepting infants of first-time parents or siblings of current patients Hospital discharge coordinator will make follow-up appointment Jack Amos [address], [place], [place]  [address] [phone]   Fax - [phone] [facility] [address], [place], [place]  [address] Phone - [phone]   Fax - [phone] Shilpa Gosrani [address], [place], [place]  [address] [phone]  [place]ortheast [place] ([address]) [facility] Bates, MD; Brassfield, MD; Cooper, Cox, MD; MD; Davis, MD; Dovico, MD; Ettefaugh, MD; Little, MD; Lowe, MD; Keiffer, MD; Melvin, MD; Sumner, MD; Williams, MD [address], [place], [place] [address] [phone] Mon-Fri 8:30-5:00 (extended evenings Mon-Thur as needed), Sat-Sun 10:00-1:00 Providers come to see babies at [facility] Accepting Medicaid for families of first-time babies and families with all children in the household age 3 and under. Must register with office prior to making appointment (M-F only). [facility] Henson, NP; Knapp, MD; Lalonde, MD; Tysinger, PA [address] [place], [place] [address] [phone] Mon-Fri 8:00-5:00 Babies seen by providers at [facility] Does NOT accept Medicaid/Commercial Insurance Only [facility] ([facility])  Artis, MD; Barnes, MD; Bratton, MD; Coccaro, MD; Lockett Gardner, MD; Kramer, MD; Marshall, MD; Netherton, MD; Poleto, MD; Skinner, MD [address] [place], [place] [address] [phone] Mon-Fri 8:30-5:30, Sat (Oct.-Mar.) 9:00-1:00 Babies seen by providers at [facility] Accepting Medicaid  [place] ([address]) [facility] Reid, MD; Warner, MD [address], [place], [place] [address] [phone] Mon-Fri 8:30-5:00, Sat 8:30-12:00 Providers come to see babies at [facility] Does NOT accept Medicaid [facility] Becker, PA; Hagler, MD; Scifres, PA; Sun, MD; Swayne, MD [address], [place],  [address] [phone] Mon-Fri 8:00-5:00 Babies seen by providers at [facility] Does NOT accept Medicaid Only accepting babies of parents who  are patients Please call early in hospitalization for appointment (limited availability)  Pediatricians Clark, MD; Frye, MD; Kelleher, MD; Mack, NP; Miller, MD; O'Keller, MD; Patterson, NP; Pudlo, MD; Puzio, MD; Thomas, MD; Tucker, MD; Twiselton, MD [address], [place], [place] [address] [phone] Mon-Fri 8:00-5:00, Sat 9:00-12:00 Providers come to see babies at [facility] Does NOT accept Medicaid  [place] ([address]) [facility] providers accepting new patients: Brake, NP; Wharton, PA [address], [place], [place] [address] [phone] Mon-Fri 8:00-5:00 Babies seen by providers at [facility] Does NOT accept Medicaid Only accepting babies of parents who are patients Please call early in hospitalization for appointment (limited availability) [facility] Gay, MD; Quinlan, MD [address] [place], [place] [address] [phone] (press [phone] to schedule appointment) Mon-Fri 8:00-5:00 Providers come to see babies at [facility] Does NOT accept Medicaid [facility] Mazer, MD [address] [place], [place] [address] [phone] Mon-Fri 8:30-5:00 (lunch 12:30-1:00), extended hours by appointment only Wed 5:00-6:30 Babies seen by [facility] providers Accepting Medicaid [facility] Banks, MD; Jordan, MD; Koberlein, MD [address], [place], [place] [address] [phone] Mon-Fri 8:00-5:00 Babies seen by [facility] providers Does NOT accept Medicaid [facility] Parker, MD; Hunter, MD; Wallace, DO [address] [place], [place] [address] [phone] Mon-Fri 8:00-5:00 Babies seen by [facility] providers Does NOT accept Medicaid [facility] Brandon, PA; Brecken, PA; Christy, NP; Dees, MD; DeClaire, MD; DeWeese, MD; Hansen, NP; Mills, NP; Parrish, NP; Smoot, NP; Summer, MD; Vapne,  MD [address] [place], [place] [address] [phone] Mon-Fri 8:30-5:00, Sat 10:00-1:00 Providers come to see babies at [facility] Does NOT accept Medicaid Free prenatal information session Tuesdays at 4:45pm [facility] Bouska, MD; Gordon, PA; Jeffery, PA; Weber, PA [address] [place] [address] [phone] Mon-Fri 7:30-5:30 Babies seen by [facility] providers [facility] [address], [place], [place]  [address] [phone]   Fax - [phone]  [place] ([address] & [address]) [facility] [place] [address] [place], [place] [address] [phone] Mon-Thur 8:00-6:00 Providers come to see babies at [facility] Accepting Medicaid [facility] Anderson, NP; Badger, MD; Beal, PA; Spencer, PA [address] [place], [place] [address] [phone] Mon-Thur 7:30-7:30, Fri 7:30-4:30 Babies seen by [facility] providers Accepting Medicaid [facility] Agbuya, MD; Klett, NP; Romgoolam, MD [address], [place], [place] [address] [phone] Mon-Fri 8:30-5:00, Sat 8:30-12:00 Providers come to see babies at [facility] Accepting Medicaid Must have "Meet & Greet" appointment at office prior to delivery [facility] ([facility]) McCord, MD; Wallace, MD; Wood, MD [address], [place], [place] [address] [phone] Mon-Wed 8:00-6:00, Thur-Fri 8:00-5:00, Sat 9:00-12:00 Providers come to see babies at [facility] Does NOT accept Medicaid Only accepting siblings of current patients [facility]  [address], [place], [place]  [address] [phone]   Fax - [phone] [facility] [address], [place], [place]  [address] [phone]   Fax -  [phone]  [place] ([address] & [address]) [facility] Cirigliano, DO; Matthews, DO [address] [place], [place] [address] [phone] Mon-Fri 7:00-5:00 Babies seen by [facility] providers Does NOT accept Medicaid [facility] Briscoe, MD; Howley, PA; Moreira, PA [address], [place], [place] [address] [phone] Mon-Fri 8:00-5:00 Babies seen by [facility] providers Accepting Medicaid [facility] Boyd, MD; Church, PA; Jones, NP; Osborn, PA [address], , [place] [address] [phone] Mon-Fri 8:00-5:00 Babies seen by providers at [facility] Accepting Medicaid  [place] ([address]) [facility] [place] Wendling, DO [address] [place], [place] [address] [phone] Mon-Fri 8:00-5:00 Babies seen by [facility] providers Does NOT accept Medicaid Limited availability, please call early in hospitalization to schedule follow-up [facility] Calderon, PA; Cummings, MD; Dillard, MD; Martin, PA; Olson, MD; VanDeven, PA [address], [place], [place] [address] [phone] Mon-Fri 8:30-5:00, Sat 9:00-12:00 Babies seen by providers at [facility] Accepting Medicaid Please register online then schedule online or call office [email or web address] ([facility] Hunter, NP; Kumar, MD; Martin Rogers, PA [address], [place], [place] [address] [phone] Mon-Fri 8:00-5:00 Babies seen by providers at [facility] Accepting Medicaid [facility] ([facility]) Fletcher, MD; Kristi Fleenor, NP; West, MD [address], [place], [place] [address] [phone] Mon-Fri 8:00-5:30, Sat&Sun by appointment (phones open at  8:30) Babies seen by [facility] providers Accepting Medicaid Must be a first-time baby or sibling of current patient [facility] [place]  [address], [place], [place]  [address] [phone]   Fax - [phone]  [place] ([address]) [facility] Brown, PA; Cowen, PA; Rice, MD; Helton, PA; Spry, MD [address] [place], [place] [address] [phone] Mon-Thur 8:00-7:00, Fri 8:00-5:00, Sat 8:00-12:00, Sun 9:00-12:00 Babies seen by [facility] providers Accepting Medicaid [facility] Coe-Goins, MD; Marshall, MD; Pierre-Louis, MD [address], [place], [place] [address] [phone] Mon-Thur 8:00-5:00 Babies seen by providers at [facility] Accepting Medicaid [facility] Bratton, MD; Coe-Goins, MD; Hayes, MD; Lewis, MD; List, MD; Lott, MD; Marshall, MD; Moran, MD; O'Neal, MD; Pierre-Louis, MD; Pitonzo, MD; Scholer, MD; Spangle, MD [address] [place], [place] [address] [phone] Mon-Fri 8:00-5:30, Sat (Oct.-Mar.) 9:00-1:00 Babies seen by providers at [facility] Accepting Medicaid Must fill out new patient packet, available online at [email or web address] [facility] ([facility]) Friddle, NP; Harris, NP; Kelly, NP; Logan, MD; Melvin, PA; Poth, MD; Ramadoss, MD; Stanton, NP [address], [place], [place] [address] [phone] Mon-Thur 8:00-5:30, Fri 8:00-5:00 Babies seen by providers at [facility] Accepting Medicaid  [place] ([address]) [facility] Dixon, PA; Kincaid, MD; Pickard, MD; Tapia, PA [address], [place], [place] [address] [phone] Mon-Fri 8:00-5:00 Babies seen by providers at [facility] Accepting Medicaid   [place] ([address]) [facility] [place] Masneri, DO; Meyers, MD; Nelson, PA [address], [place], [place] [address] [phone] Mon-Fri 8:00-5:00 Babies seen by providers at [facility] Does NOT accept Medicaid Limited appointment availability, please call early in hospitalization  [facility] [place] Kunedd, DO; McGowen, MD [address], [place], [place] [address] [phone] Mon-Fri 8:00-5:00 Babies seen by [facility] providers Does NOT accept Medicaid [facility] [place] Cameron, MD; MacDonald, MD; Michaels, PA; Nayak, MD [address] [place] [address], [place], [place] [address] [phone] Mon-Fri 8:00-5:00 After hours clinic ([address] [place],  [address]) [phone] Mon-Fri 5:00-8:00, Sat 12:00-6:00, Sun 10:00-4:00 Babies seen by [facility] providers Accepting Medicaid [facility] [place] [address], [place], [place]  [address] [phone]   Fax - [phone]  [place] ([address]) [facility] [place] [facility] Andy, MD [address], [place], [place] [address] [phone] Mon-Fri 8:00-5:00 Babies seen by [facility] providers Does NOT accept Medicaid [facility] [place] ([facility] [place]) Eksir, MD [address], [place], [place] [address] [phone] Mon-Thur 8:00-7:00, Fri 8:00-5:00, Sat 8:00-12:00 Babies seen by providers at [facility] Accepting Medicaid - but does not have vaccinations in office (must be received elsewhere) Limited availability, please call early in hospitalization  [place] ([address]) [facility]  Charlene Flemming, MD [address], [place] [address] [phone]  Fax [phone]  [facility]  Kimberly Newton, MD, Annamarie Streilein, PA, Carla Hampton, PA [address] [place], [place]  [address] [phone] [facility]  [address], [place], [place] [address] [phone] [address], [place], [place] [address] [phone] (West Office)  [facility] [address], [place], [place] [address] [phone] [facility] [address], [place], [place] [address] [phone] [facility] [address], [place], [place] [address] [phone] [facility] [address], [place], [place] [address] [phone] [facility] [address], [place], [place] [address] [phone] [facility] [address], [place], [place] [address] [phone] [facility]  [address], [place], [place] [address] [phone] [facility] [address] [place], [place], [place] [address] [phone] [facility] [address], [place], [place] [address] [phone] [facility] [address], [place], [place] [address] [phone]  Childbirth Education Options: [facility] Classes:  Childbirth education classes can help you get ready for a positive parenting experience. You can also meet other expectant parents and get free stuff for your baby. Each class runs for five weeks on the same night and costs $45 for the mother-to-be and her support person. Medicaid covers the cost if you are eligible. Call [phone] to register.  [facility] Childbirth Education: Classes can vary in availability and schedule is subject to change. For most up-to-date information please visit [email or web address] to review and register.        

## 2021-08-04 LAB — AFP, SERUM, OPEN SPINA BIFIDA
AFP MoM: 1.08
AFP Value: 40.4 ng/mL
Gest. Age on Collection Date: 17 weeks
Maternal Age At EDD: 41.5 yr
OSBR Risk 1 IN: 9540
Test Results:: NEGATIVE
Weight: 166 [lb_av]

## 2021-08-12 ENCOUNTER — Other Ambulatory Visit: Payer: Self-pay

## 2021-08-12 ENCOUNTER — Encounter: Payer: Self-pay | Admitting: *Deleted

## 2021-08-12 ENCOUNTER — Ambulatory Visit: Payer: Medicaid Other | Attending: Nurse Practitioner

## 2021-08-12 ENCOUNTER — Ambulatory Visit: Payer: Medicaid Other | Admitting: *Deleted

## 2021-08-12 VITALS — BP 120/71 | HR 86

## 2021-08-12 DIAGNOSIS — O09522 Supervision of elderly multigravida, second trimester: Secondary | ICD-10-CM | POA: Insufficient documentation

## 2021-08-12 DIAGNOSIS — O0990 Supervision of high risk pregnancy, unspecified, unspecified trimester: Secondary | ICD-10-CM

## 2021-08-12 DIAGNOSIS — O468X9 Other antepartum hemorrhage, unspecified trimester: Secondary | ICD-10-CM | POA: Diagnosis not present

## 2021-08-12 DIAGNOSIS — R011 Cardiac murmur, unspecified: Secondary | ICD-10-CM

## 2021-08-12 DIAGNOSIS — O09529 Supervision of elderly multigravida, unspecified trimester: Secondary | ICD-10-CM | POA: Diagnosis not present

## 2021-08-12 DIAGNOSIS — O418X90 Other specified disorders of amniotic fluid and membranes, unspecified trimester, not applicable or unspecified: Secondary | ICD-10-CM | POA: Diagnosis not present

## 2021-08-12 DIAGNOSIS — Z3490 Encounter for supervision of normal pregnancy, unspecified, unspecified trimester: Secondary | ICD-10-CM

## 2021-08-13 ENCOUNTER — Other Ambulatory Visit: Payer: Self-pay | Admitting: *Deleted

## 2021-08-13 DIAGNOSIS — O09522 Supervision of elderly multigravida, second trimester: Secondary | ICD-10-CM

## 2021-08-27 ENCOUNTER — Ambulatory Visit (INDEPENDENT_AMBULATORY_CARE_PROVIDER_SITE_OTHER): Payer: Medicaid Other | Admitting: Family Medicine

## 2021-08-27 ENCOUNTER — Other Ambulatory Visit: Payer: Self-pay

## 2021-08-27 VITALS — BP 125/85 | HR 97 | Wt 167.8 lb

## 2021-08-27 DIAGNOSIS — O0990 Supervision of high risk pregnancy, unspecified, unspecified trimester: Secondary | ICD-10-CM

## 2021-08-27 DIAGNOSIS — O09522 Supervision of elderly multigravida, second trimester: Secondary | ICD-10-CM

## 2021-08-27 NOTE — Progress Notes (Signed)
   Subjective:  Crystal Boyd is a 40 y.o. [id number] at [redacted]w[redacted]d being seen today for ongoing prenatal care.  She is currently monitored for the following issues for this low-risk pregnancy and has Supervision of high risk pregnancy, antepartum; Subchorionic hemorrhage, antepartum; Heart murmur; AMA (advanced maternal age) multigravida 35+; History of miscarriage, currently pregnant, first trimester; and Alpha thalassemia silent carrier on their problem list.  Patient reports no complaints.  Contractions: Not present. Vag. Bleeding: None.  Movement: Present. Denies leaking of fluid.   The following portions of the patient's history were reviewed and updated as appropriate: allergies, current medications, past family history, past medical history, past social history, past surgical history and problem list. Problem list updated.  Objective:   Vitals:   08/27/21 1128  BP: 125/85  Pulse: 97  Weight: 167 lb 12.8 oz (76.1 kg)    Fetal Status: Fetal Heart Rate (bpm): 154   Movement: Present     General:  Alert, oriented and cooperative. Patient is in no acute distress.  Skin: Skin is warm and dry. No rash noted.   Cardiovascular: Normal heart rate noted  Respiratory: Normal respiratory effort, no problems with respiration noted  Abdomen: Soft, gravid, appropriate for gestational age. Pain/Pressure: Absent     Pelvic: Vag. Bleeding: None     Cervical exam deferred        Extremities: Normal range of motion.  Edema: None  Mental Status: Normal mood and affect. Normal behavior. Normal judgment and thought content.   Urinalysis:      Assessment and Plan:  Pregnancy: [id number] at [redacted]w[redacted]d  1. Supervision of high risk pregnancy, antepartum BP and FHR normal Reviewed normal partner testing with neg alpha thal testing, reassured  2. Multigravida of advanced maternal age in second trimester Following w MFM  Preterm labor symptoms and general obstetric precautions including but not limited to vaginal  bleeding, contractions, leaking of fluid and fetal movement were reviewed in detail with the patient. Please refer to After Visit Summary for other counseling recommendations.  Return in 4 weeks (on 09/24/2021) for ob visit, [facility].   Venora Maples, MD

## 2021-08-27 NOTE — Patient Instructions (Signed)

## 2021-09-23 ENCOUNTER — Encounter: Payer: Self-pay | Admitting: *Deleted

## 2021-09-23 ENCOUNTER — Other Ambulatory Visit: Payer: Self-pay | Admitting: *Deleted

## 2021-09-23 ENCOUNTER — Ambulatory Visit: Payer: Medicaid Other | Attending: Obstetrics

## 2021-09-23 ENCOUNTER — Ambulatory Visit: Payer: Medicaid Other | Admitting: *Deleted

## 2021-09-23 ENCOUNTER — Other Ambulatory Visit: Payer: Self-pay

## 2021-09-23 VITALS — BP 126/75

## 2021-09-23 VITALS — BP 144/72 | HR 96

## 2021-09-23 DIAGNOSIS — Z3A25 25 weeks gestation of pregnancy: Secondary | ICD-10-CM | POA: Diagnosis not present

## 2021-09-23 DIAGNOSIS — O0990 Supervision of high risk pregnancy, unspecified, unspecified trimester: Secondary | ICD-10-CM

## 2021-09-23 DIAGNOSIS — Z362 Encounter for other antenatal screening follow-up: Secondary | ICD-10-CM

## 2021-09-23 DIAGNOSIS — O09522 Supervision of elderly multigravida, second trimester: Secondary | ICD-10-CM | POA: Insufficient documentation

## 2021-09-23 DIAGNOSIS — O418X90 Other specified disorders of amniotic fluid and membranes, unspecified trimester, not applicable or unspecified: Secondary | ICD-10-CM

## 2021-09-23 DIAGNOSIS — O468X9 Other antepartum hemorrhage, unspecified trimester: Secondary | ICD-10-CM | POA: Diagnosis present

## 2021-09-23 DIAGNOSIS — R011 Cardiac murmur, unspecified: Secondary | ICD-10-CM | POA: Insufficient documentation

## 2021-09-24 ENCOUNTER — Ambulatory Visit (INDEPENDENT_AMBULATORY_CARE_PROVIDER_SITE_OTHER): Payer: Medicaid Other | Admitting: Family Medicine

## 2021-09-24 VITALS — BP 130/83 | HR 106 | Wt 172.1 lb

## 2021-09-24 DIAGNOSIS — D563 Thalassemia minor: Secondary | ICD-10-CM

## 2021-09-24 DIAGNOSIS — O0990 Supervision of high risk pregnancy, unspecified, unspecified trimester: Secondary | ICD-10-CM

## 2021-09-24 DIAGNOSIS — O468X9 Other antepartum hemorrhage, unspecified trimester: Secondary | ICD-10-CM

## 2021-09-24 DIAGNOSIS — O418X90 Other specified disorders of amniotic fluid and membranes, unspecified trimester, not applicable or unspecified: Secondary | ICD-10-CM

## 2021-09-24 DIAGNOSIS — O09522 Supervision of elderly multigravida, second trimester: Secondary | ICD-10-CM

## 2021-09-24 NOTE — Progress Notes (Signed)
   PRENATAL VISIT NOTE  Subjective:  Crystal Boyd is a 40 y.o. [id number] at [redacted]w[redacted]d being seen today for ongoing prenatal care.  She is currently monitored for the following issues for this high-risk pregnancy and has Supervision of high risk pregnancy, antepartum; Subchorionic hemorrhage, antepartum; Heart murmur; AMA (advanced maternal age) multigravida 35+; History of miscarriage, currently pregnant, first trimester; and Alpha thalassemia silent carrier on their problem list.  Patient reports backache.  Contractions: Not present. Vag. Bleeding: None.  Movement: Present. Denies leaking of fluid.   The following portions of the patient's history were reviewed and updated as appropriate: allergies, current medications, past family history, past medical history, past social history, past surgical history and problem list.   Objective:   Vitals:   09/24/21 1051  BP: 130/83  Pulse: (!) 106  Weight: 172 lb 1.6 oz (78.1 kg)    Fetal Status: Fetal Heart Rate (bpm): 154   Movement: Present     General:  Alert, oriented and cooperative. Patient is in no acute distress.  Skin: Skin is warm and dry. No rash noted.   Cardiovascular: Normal heart rate noted  Respiratory: Normal respiratory effort, no problems with respiration noted  Abdomen: Soft, gravid, appropriate for gestational age.  Pain/Pressure: Present     Pelvic: Cervical exam deferred        Extremities: Normal range of motion.  Edema: None  Mental Status: Normal mood and affect. Normal behavior. Normal judgment and thought content.   Assessment and Plan:  Pregnancy: G4P1021 at [redacted]w[redacted]d 1. Supervision of high risk pregnancy, antepartum Declined flu shot 28 wk labs next visit Back pain in pregnancy- located on left side, mostly in AM and with prolonged sitting. Discussed stretching, use of heat, massage, hydrotherapy and chiropractic care.  No red flags.   2. Subchorionic hemorrhage, antepartum No bleeding today  3. Multigravida of  advanced maternal age in second trimester LR NIP/AFP WNL US WNL Has follow up in Jan for growth  4. Alpha thalassemia silent carrier Spoke with [facility] at [facility] Patient partner has received screening and was negative per patient  Preterm labor symptoms and general obstetric precautions including but not limited to vaginal bleeding, contractions, leaking of fluid and fetal movement were reviewed in detail with the patient. Please refer to After Visit Summary for other counseling recommendations.   Return in about 4 weeks (around 10/22/2021) for Routine prenatal care, 28 wk labs.  Future Appointments  Date Time Provider Department Center  11/12/2021  9:30 AM [facility] NURSE [facility]  11/12/2021  9:45 AM [facility]    Federico Flake, MD

## 2021-10-20 ENCOUNTER — Other Ambulatory Visit: Payer: Self-pay | Admitting: *Deleted

## 2021-10-20 DIAGNOSIS — O0990 Supervision of high risk pregnancy, unspecified, unspecified trimester: Secondary | ICD-10-CM

## 2021-10-22 ENCOUNTER — Other Ambulatory Visit: Payer: Self-pay

## 2021-10-22 ENCOUNTER — Encounter: Payer: Self-pay | Admitting: Family Medicine

## 2021-10-22 ENCOUNTER — Other Ambulatory Visit: Payer: Medicaid Other

## 2021-10-22 ENCOUNTER — Ambulatory Visit (INDEPENDENT_AMBULATORY_CARE_PROVIDER_SITE_OTHER): Payer: Medicaid Other | Admitting: Family Medicine

## 2021-10-22 VITALS — BP 121/82 | HR 92 | Wt 173.8 lb

## 2021-10-22 DIAGNOSIS — O0990 Supervision of high risk pregnancy, unspecified, unspecified trimester: Secondary | ICD-10-CM

## 2021-10-22 DIAGNOSIS — Z23 Encounter for immunization: Secondary | ICD-10-CM | POA: Diagnosis not present

## 2021-10-22 DIAGNOSIS — O09522 Supervision of elderly multigravida, second trimester: Secondary | ICD-10-CM

## 2021-10-22 DIAGNOSIS — Z3009 Encounter for other general counseling and advice on contraception: Secondary | ICD-10-CM

## 2021-10-22 MED ORDER — CYCLOBENZAPRINE HCL 5 MG PO TABS: 5.0000 mg | ORAL_TABLET | Freq: Three times a day (TID) | ORAL | 0 refills | Status: DC | PRN

## 2021-10-22 NOTE — Patient Instructions (Signed)

## 2021-10-22 NOTE — Progress Notes (Signed)
° °  Subjective:  Crystal Boyd is a 40 y.o. [id number] at [redacted]w[redacted]d being seen today for ongoing prenatal care.  She is currently monitored for the following issues for this low-risk pregnancy and has Supervision of high risk pregnancy, antepartum; Subchorionic hemorrhage, antepartum; Heart murmur; AMA (advanced maternal age) multigravida 35+; History of miscarriage, currently pregnant, first trimester; Alpha thalassemia silent carrier; and Unwanted fertility on their problem list.  Patient reports no complaints.  Contractions: Not present. Vag. Bleeding: None.  Movement: Present. Denies leaking of fluid.   The following portions of the patient's history were reviewed and updated as appropriate: allergies, current medications, past family history, past medical history, past social history, past surgical history and problem list. Problem list updated.  Objective:   Vitals:   10/22/21 0846  BP: 121/82  Pulse: 92  Weight: 173 lb 12.8 oz (78.8 kg)    Fetal Status: Fetal Heart Rate (bpm): 157   Movement: Present     General:  Alert, oriented and cooperative. Patient is in no acute distress.  Skin: Skin is warm and dry. No rash noted.   Cardiovascular: Normal heart rate noted  Respiratory: Normal respiratory effort, no problems with respiration noted  Abdomen: Soft, gravid, appropriate for gestational age. Pain/Pressure: Present     Pelvic: Vag. Bleeding: None     Cervical exam deferred        Extremities: Normal range of motion.  Edema: None  Mental Status: Normal mood and affect. Normal behavior. Normal judgment and thought content.   Urinalysis:      Assessment and Plan:  Pregnancy: G4P1021 at [redacted]w[redacted]d  1. Supervision of high risk pregnancy, antepartum BP and FHR normal Third trimester labs today Accepts tdap Ongoing back spasms, will trial different sleeping positions, in hand rx sent for flexeril - Tdap vaccine greater than or equal to 7yo IM  2. Multigravida of advanced maternal age in  second trimester Antenatal testing at 36 weeks  3. Unwanted fertility Desires BTL Consented, form signed  Preterm labor symptoms and general obstetric precautions including but not limited to vaginal bleeding, contractions, leaking of fluid and fetal movement were reviewed in detail with the patient. Please refer to After Visit Summary for other counseling recommendations.  Return in 2 weeks (on 11/05/2021) for [facility], ob visit.   Venora Maples, MD

## 2021-10-23 LAB — CBC
Hematocrit: 32.1 % — ABNORMAL LOW (ref 34.0–46.6)
Hemoglobin: 10.2 g/dL — ABNORMAL LOW (ref 11.1–15.9)
MCH: 26.2 pg — ABNORMAL LOW (ref 26.6–33.0)
MCHC: 31.8 g/dL (ref 31.5–35.7)
MCV: 82 fL (ref 79–97)
Platelets: 283 10*3/uL (ref 150–450)
RBC: 3.9 x10E6/uL (ref 3.77–5.28)
RDW: 13.8 % (ref 11.7–15.4)
WBC: 10.7 10*3/uL (ref 3.4–10.8)

## 2021-10-23 LAB — GLUCOSE TOLERANCE, 2 HOURS W/ 1HR
Glucose, 1 hour: 199 mg/dL — ABNORMAL HIGH (ref 70–179)
Glucose, 2 hour: 150 mg/dL (ref 70–152)
Glucose, Fasting: 82 mg/dL (ref 70–91)

## 2021-10-23 LAB — HIV ANTIBODY (ROUTINE TESTING W REFLEX): HIV Screen 4th Generation wRfx: NONREACTIVE

## 2021-10-23 LAB — RPR: RPR Ser Ql: NONREACTIVE

## 2021-10-24 ENCOUNTER — Encounter: Payer: Self-pay | Admitting: Family Medicine

## 2021-10-24 ENCOUNTER — Telehealth: Payer: Self-pay

## 2021-10-24 DIAGNOSIS — O24419 Gestational diabetes mellitus in pregnancy, unspecified control: Secondary | ICD-10-CM | POA: Insufficient documentation

## 2021-10-24 DIAGNOSIS — Z8632 Personal history of gestational diabetes: Secondary | ICD-10-CM | POA: Insufficient documentation

## 2021-10-24 NOTE — Telephone Encounter (Signed)
-----   Message from Venora Maples, MD sent at 10/24/2021 10:57 AM EST ----- Abnormal GTT, 28 wks labs otherwise normal Patient notified by MyChart Clinical staff please place referral to DM educator and send testing supplies

## 2021-10-27 ENCOUNTER — Other Ambulatory Visit: Payer: Self-pay | Admitting: *Deleted

## 2021-10-27 DIAGNOSIS — O24419 Gestational diabetes mellitus in pregnancy, unspecified control: Secondary | ICD-10-CM

## 2021-10-27 MED ORDER — ACCU-CHEK SOFTCLIX LANCETS MISC: 12 refills | Status: DC

## 2021-10-27 MED ORDER — GLUCOSE BLOOD VI STRP: ORAL_STRIP | 12 refills | Status: DC

## 2021-10-27 MED ORDER — ACCU-CHEK GUIDE W/DEVICE KIT: 1.0000 | PACK | Freq: Four times a day (QID) | 0 refills | Status: DC

## 2021-10-27 NOTE — Telephone Encounter (Signed)
Call placed to pt. Spoke with pt. Pt given results and recommendations per Dr Crissie Reese. Pt verbalized understanding and agreeable to plan of care.  Judeth Cornfield, RN

## 2021-11-04 ENCOUNTER — Encounter: Payer: Medicaid Other | Attending: Family Medicine | Admitting: Registered"

## 2021-11-04 ENCOUNTER — Ambulatory Visit: Payer: Medicaid Other | Admitting: Registered"

## 2021-11-04 ENCOUNTER — Other Ambulatory Visit: Payer: Self-pay

## 2021-11-04 DIAGNOSIS — Z3A00 Weeks of gestation of pregnancy not specified: Secondary | ICD-10-CM | POA: Insufficient documentation

## 2021-11-04 DIAGNOSIS — O24419 Gestational diabetes mellitus in pregnancy, unspecified control: Secondary | ICD-10-CM

## 2021-11-04 NOTE — Progress Notes (Signed)
Patient was seen for Gestational Diabetes self-management on 11/04/21  Start time 1018 and End time 1115   Estimated due date: 01/06/22; [redacted]w[redacted]d  Clinical: Medications: aspirin, prenatal, flexeril Medical History: reviewed Labs: OGTT 1 hr 199, A1c 5.2%   Dietary and Lifestyle History: Pt states she does not have experience or knowledge of diabetes. Pt reports understanding of carbohydrates.  Pt states she eats rice daily.  Physical Activity: not assessed Stress: not assessed Sleep: not assessed  24 hr Recall:  First Meal: hone nut cheerios, banana, cashews, water Snack: cheetos Second meal: [employer] tomato soup/turkey sandwich, with baguette  Snack:3 oreos Third meal: filet mignon, salad, 1 c rice Snack: Beverages:water, soda water, 2x/month coffee with flavored creamer  NUTRITION INTERVENTION  Nutrition education (E-1) on the following topics:   Initial Follow-up  [x]  []  Definition of Gestational Diabetes [x]  []  Why dietary management is important in controlling blood glucose [x]  []  Effects each nutrient has on blood glucose levels [x]  []  Simple carbohydrates vs complex carbohydrates []  []  Fluid intake [x]  []  Creating a balanced meal plan [x]  []  Carbohydrate counting  [x]  []  When to check blood glucose levels [x]  []  Proper blood glucose monitoring techniques [x]  []  Effect of stress and stress reduction techniques  [x]  []  Exercise effect on blood glucose levels, appropriate exercise during pregnancy [x]  []  Importance of limiting caffeine and abstaining from alcohol and smoking [x]  []  Medications used for blood sugar control during pregnancy [x]  []  Hypoglycemia and rule of 15 [x]  []  Postpartum self care  Blood glucose monitor given: Accu-chek Guide Me Lot Exp: 12/19/2022 CBG: 124 mg/dL   Patient instructed to monitor glucose levels: FBS: 60 - ? 95 mg/dL (some clinics use 90 for cutoff) 1 hour: ? 140 mg/dL 2 hour: ? mg/dL  Patient received  handouts: Nutrition Diabetes and Pregnancy Carbohydrate Counting List  Patient will be seen for follow-up as needed.

## 2021-11-05 ENCOUNTER — Ambulatory Visit (INDEPENDENT_AMBULATORY_CARE_PROVIDER_SITE_OTHER): Payer: Medicaid Other | Admitting: Certified Nurse Midwife

## 2021-11-05 VITALS — BP 118/73 | HR 98 | Wt 171.2 lb

## 2021-11-05 DIAGNOSIS — O24410 Gestational diabetes mellitus in pregnancy, diet controlled: Secondary | ICD-10-CM

## 2021-11-05 DIAGNOSIS — Z3A31 31 weeks gestation of pregnancy: Secondary | ICD-10-CM

## 2021-11-05 DIAGNOSIS — O0993 Supervision of high risk pregnancy, unspecified, third trimester: Secondary | ICD-10-CM

## 2021-11-05 NOTE — Progress Notes (Signed)
° °  PRENATAL VISIT NOTE  Subjective:  Crystal Boyd is a 40 y.o. [id number] at [redacted]w[redacted]d being seen today for ongoing prenatal care.  She is currently monitored for the following issues for this high-risk pregnancy and has Supervision of high risk pregnancy, antepartum; Subchorionic hemorrhage, antepartum; Heart murmur; AMA (advanced maternal age) multigravida 35+; History of miscarriage, currently pregnant, first trimester; Alpha thalassemia silent carrier; Unwanted fertility; and GDM (gestational diabetes mellitus) on their problem list.  Patient reports no complaints. Just went to diabetes education yesterday and began testing. All have been in range so far. Contractions: Irritability. Vag. Bleeding: None.  Movement: Present. Denies leaking of fluid.   The following portions of the patient's history were reviewed and updated as appropriate: allergies, current medications, past family history, past medical history, past social history, past surgical history and problem list.   Objective:   Vitals:   11/05/21 0923  BP: 118/73  Pulse: 98  Weight: 171 lb 3.2 oz (77.7 kg)   Fetal Status: Fetal Heart Rate (bpm): 150 Fundal Height: 31 cm Movement: Present  Presentation: Vertex  General:  Alert, oriented and cooperative. Patient is in no acute distress.  Skin: Skin is warm and dry. No rash noted.   Cardiovascular: Normal heart rate noted  Respiratory: Normal respiratory effort, no problems with respiration noted  Abdomen: Soft, gravid, appropriate for gestational age.  Pain/Pressure: Absent     Pelvic: Cervical exam deferred        Extremities: Normal range of motion.  Edema: None  Mental Status: Normal mood and affect. Normal behavior. Normal judgment and thought content.   Assessment and Plan:  Pregnancy: G4P1021 at [redacted]w[redacted]d 1. Supervision of high risk pregnancy in third trimester - Doing well, feeling regular and vigorous fetal movement   2. [redacted] weeks gestation of pregnancy - Routine OB care  -  Reviewed prevention of cold/flu and encouraged mask wearing in public, immune boosting vitamins and frequent handwashing. Advised on where to get tested if needed, when to come to [facility] for treatment and that she can call us if she needs tamiflu.  3. Diet controlled gestational diabetes mellitus (GDM) in third trimester - Per pt, initial testing has been in range. Encouraged her to take daily walks, especially after larger or more carb heavy meals.  - Reviewed need for weekly BPP w NST starting at 36wks and possibility of early IOL if glucose is not well controlled.   Preterm labor symptoms and general obstetric precautions including but not limited to vaginal bleeding, contractions, leaking of fluid and fetal movement were reviewed in detail with the patient. Please refer to After Visit Summary for other counseling recommendations.   Return in about 2 weeks (around 11/19/2021) for IN-PERSON, LOB.  Future Appointments  Date Time Provider Department Center  11/12/2021  9:30 AM [facility] NURSE [facility]  11/12/2021  9:45 AM [facility] US5 [facility]  11/19/2021  8:55 AM Crissie Reese, Mary Sella, MD [facility]    Bernerd Limbo, CNM

## 2021-11-05 NOTE — Patient Instructions (Signed)

## 2021-11-09 NOTE — L&D Delivery Note (Addendum)
OB/GYN Faculty Practice Delivery Note  Crystal Boyd is a 41 y.o. [id number] s/p SVD at [redacted]w[redacted]d. She was admitted for IOL d/t AMA, A1GDM.   ROM: 15h 59m with clear fluid GBS Status: positive Maximum Maternal Temperature: 99.0  Labor Progress: Pt admitted for IOL.  S/P Cytotec x 3 and FB. SROM at 2117, later augmented with Pitocin. Prolonged deceleration overnight, throughout shift category I and II with maternal position changes and IV fluid boluses.   Delivery Date/Time: 12/31/2021 @ 1306 Delivery: Called to room and patient was complete and pushing. After a 4 minute 2nd stage,  a viable female was delivered over intact perineum. Head delivered ROA with cord presentation. Loose nuchal cord present delivered via somersault. Shoulder and body delivered in usual fashion. Infant with spontaneous cry, placed on mother's abdomen, dried and stimulated. Cord clamped x 2 after 1-minute delay, and cut by grandmother. Cord blood drawn. The placenta separated spontaneously and delivered via controlled cord traction and maternal pushing effort. Fundus firm with massage and Pitocin. Labia, perineum, vagina, and cervix inspected inspected with 2nd degree perineum laceration, repaired with 3.0 Vicryl on CT in usual fashion. After repair, continued trickling of BRB, uterus firm at 1 FB above umbilicus, Cytotec 1000 mcg per rectal.   Placenta: Intact, 3 VC, to L&D Complications: None Lacerations: 2nd degree perineum EBL: 200 Analgesia: Epidural   Postpartum Planning Mom to postpartum.  Baby to Couplet care / Skin to Skin. [ ]  message to sent to schedule follow-up  [ ]  vaccines UTD   Infant: female   APGARs 7/9   3675 g  , Student-MidWife

## 2021-11-12 ENCOUNTER — Other Ambulatory Visit: Payer: Self-pay

## 2021-11-12 ENCOUNTER — Ambulatory Visit: Payer: Medicaid Other | Attending: Obstetrics and Gynecology

## 2021-11-12 ENCOUNTER — Ambulatory Visit: Payer: Medicaid Other | Admitting: *Deleted

## 2021-11-12 ENCOUNTER — Other Ambulatory Visit: Payer: Self-pay | Admitting: *Deleted

## 2021-11-12 ENCOUNTER — Encounter: Payer: Self-pay | Admitting: *Deleted

## 2021-11-12 VITALS — BP 116/64 | HR 98

## 2021-11-12 DIAGNOSIS — Z3A32 32 weeks gestation of pregnancy: Secondary | ICD-10-CM

## 2021-11-12 DIAGNOSIS — O09522 Supervision of elderly multigravida, second trimester: Secondary | ICD-10-CM | POA: Insufficient documentation

## 2021-11-12 DIAGNOSIS — O09523 Supervision of elderly multigravida, third trimester: Secondary | ICD-10-CM | POA: Diagnosis present

## 2021-11-12 DIAGNOSIS — O24410 Gestational diabetes mellitus in pregnancy, diet controlled: Secondary | ICD-10-CM | POA: Diagnosis not present

## 2021-11-12 DIAGNOSIS — O418X90 Other specified disorders of amniotic fluid and membranes, unspecified trimester, not applicable or unspecified: Secondary | ICD-10-CM | POA: Insufficient documentation

## 2021-11-12 DIAGNOSIS — O468X9 Other antepartum hemorrhage, unspecified trimester: Secondary | ICD-10-CM

## 2021-11-12 DIAGNOSIS — O0990 Supervision of high risk pregnancy, unspecified, unspecified trimester: Secondary | ICD-10-CM | POA: Insufficient documentation

## 2021-11-12 DIAGNOSIS — O24419 Gestational diabetes mellitus in pregnancy, unspecified control: Secondary | ICD-10-CM

## 2021-11-12 DIAGNOSIS — R011 Cardiac murmur, unspecified: Secondary | ICD-10-CM | POA: Insufficient documentation

## 2021-11-19 ENCOUNTER — Ambulatory Visit (INDEPENDENT_AMBULATORY_CARE_PROVIDER_SITE_OTHER): Payer: Medicaid Other | Admitting: Family Medicine

## 2021-11-19 ENCOUNTER — Other Ambulatory Visit: Payer: Self-pay

## 2021-11-19 ENCOUNTER — Encounter: Payer: Self-pay | Admitting: Family Medicine

## 2021-11-19 VITALS — BP 119/79 | HR 103 | Wt 171.8 lb

## 2021-11-19 DIAGNOSIS — O09523 Supervision of elderly multigravida, third trimester: Secondary | ICD-10-CM

## 2021-11-19 DIAGNOSIS — O0990 Supervision of high risk pregnancy, unspecified, unspecified trimester: Secondary | ICD-10-CM

## 2021-11-19 DIAGNOSIS — O24419 Gestational diabetes mellitus in pregnancy, unspecified control: Secondary | ICD-10-CM

## 2021-11-19 DIAGNOSIS — Z3009 Encounter for other general counseling and advice on contraception: Secondary | ICD-10-CM

## 2021-11-19 NOTE — Progress Notes (Signed)
° °  Subjective:  Crystal Boyd is a 41 y.o. [id number] at [redacted]w[redacted]d being seen today for ongoing prenatal care.  She is currently monitored for the following issues for this high-risk pregnancy and has Supervision of high risk pregnancy, antepartum; Subchorionic hemorrhage, antepartum; Heart murmur; AMA (advanced maternal age) multigravida 59+; History of miscarriage, currently pregnant, first trimester; Alpha thalassemia silent carrier; Unwanted fertility; and GDM (gestational diabetes mellitus) on their problem list.  Patient reports no complaints.  Contractions: Not present. Vag. Bleeding: None.  Movement: Present. Denies leaking of fluid.   The following portions of the patient's history were reviewed and updated as appropriate: allergies, current medications, past family history, past medical history, past social history, past surgical history and problem list. Problem list updated.  Objective:   Vitals:   11/19/21 0859  BP: 119/79  Pulse: (!) 103  Weight: 171 lb 12.8 oz (77.9 kg)    Fetal Status: Fetal Heart Rate (bpm): 144   Movement: Present     General:  Alert, oriented and cooperative. Patient is in no acute distress.  Skin: Skin is warm and dry. No rash noted.   Cardiovascular: Normal heart rate noted  Respiratory: Normal respiratory effort, no problems with respiration noted  Abdomen: Soft, gravid, appropriate for gestational age. Pain/Pressure: Absent     Pelvic: Vag. Bleeding: None     Cervical exam deferred        Extremities: Normal range of motion.  Edema: None  Mental Status: Normal mood and affect. Normal behavior. Normal judgment and thought content.   Urinalysis:      \  Assessment and Plan:  Pregnancy: G4P1021 at [redacted]w[redacted]d  1. Supervision of high risk pregnancy, antepartum BP and FHR normal Having trouble finding peds care for after delivery Given I have seen her multiple times and oldest child is 42, OK to transfer to [facility] clinic  2. Gestational diabetes mellitus  (GDM) in third trimester, gestational diabetes method of control unspecified Log reiewed, 78% at goal, doing well (see pic above) Following w MFM for growth scans  3. Unwanted fertility BTL papers signed 11/01/21  4. Multigravida of advanced maternal age in third trimester Start antenatal testing at 42 weeks  Preterm labor symptoms and general obstetric precautions including but not limited to vaginal bleeding, contractions, leaking of fluid and fetal movement were reviewed in detail with the patient. Please refer to After Visit Summary for other counseling recommendations.  Return in 2 weeks (on 12/03/2021) for Dyad patient, ob visit.   Clarnce Flock, MD

## 2021-11-19 NOTE — Patient Instructions (Signed)

## 2021-12-09 ENCOUNTER — Other Ambulatory Visit (HOSPITAL_COMMUNITY)
Admission: RE | Admit: 2021-12-09 | Discharge: 2021-12-09 | Disposition: A | Discharge status: Home / Self Care | Payer: Medicaid Other | Source: Ambulatory Visit | Attending: Family Medicine | Admitting: Family Medicine

## 2021-12-09 ENCOUNTER — Other Ambulatory Visit: Payer: Self-pay

## 2021-12-09 ENCOUNTER — Ambulatory Visit: Payer: Medicaid Other | Admitting: *Deleted

## 2021-12-09 ENCOUNTER — Ambulatory Visit (INDEPENDENT_AMBULATORY_CARE_PROVIDER_SITE_OTHER): Payer: Medicaid Other | Admitting: Family Medicine

## 2021-12-09 ENCOUNTER — Ambulatory Visit: Payer: Medicaid Other | Attending: Obstetrics and Gynecology

## 2021-12-09 ENCOUNTER — Other Ambulatory Visit: Payer: Self-pay | Admitting: Obstetrics and Gynecology

## 2021-12-09 VITALS — BP 124/82 | HR 66 | Wt 173.6 lb

## 2021-12-09 VITALS — BP 123/68 | HR 90

## 2021-12-09 DIAGNOSIS — O0990 Supervision of high risk pregnancy, unspecified, unspecified trimester: Secondary | ICD-10-CM

## 2021-12-09 DIAGNOSIS — O468X9 Other antepartum hemorrhage, unspecified trimester: Secondary | ICD-10-CM | POA: Insufficient documentation

## 2021-12-09 DIAGNOSIS — O09523 Supervision of elderly multigravida, third trimester: Secondary | ICD-10-CM | POA: Diagnosis not present

## 2021-12-09 DIAGNOSIS — R011 Cardiac murmur, unspecified: Secondary | ICD-10-CM

## 2021-12-09 DIAGNOSIS — O418X90 Other specified disorders of amniotic fluid and membranes, unspecified trimester, not applicable or unspecified: Secondary | ICD-10-CM

## 2021-12-09 DIAGNOSIS — Z3A36 36 weeks gestation of pregnancy: Secondary | ICD-10-CM | POA: Diagnosis not present

## 2021-12-09 DIAGNOSIS — O24419 Gestational diabetes mellitus in pregnancy, unspecified control: Secondary | ICD-10-CM | POA: Insufficient documentation

## 2021-12-09 DIAGNOSIS — Z3009 Encounter for other general counseling and advice on contraception: Secondary | ICD-10-CM

## 2021-12-09 LAB — OB RESULTS CONSOLE GBS: GBS: POSITIVE

## 2021-12-09 NOTE — Patient Instructions (Signed)

## 2021-12-09 NOTE — Progress Notes (Signed)
° ° °  Subjective:  Crystal Boyd is a 41 y.o. [id number] at [redacted]w[redacted]d being seen today for ongoing prenatal care.  She is currently monitored for the following issues for this high-risk pregnancy and has Supervision of high risk pregnancy, antepartum; Subchorionic hemorrhage, antepartum; Heart murmur; AMA (advanced maternal age) multigravida 41+; History of miscarriage, currently pregnant, first trimester; Alpha thalassemia silent carrier; Unwanted fertility; and GDM (gestational diabetes mellitus) on their problem list.  Patient reports no complaints.  Contractions: Not present. Vag. Bleeding: None.  Movement: Present. Denies leaking of fluid.   The following portions of the patient's history were reviewed and updated as appropriate: allergies, current medications, past family history, past medical history, past social history, past surgical history and problem list. Problem list updated.  Objective:   Vitals:   12/09/21 0931  BP: 124/82  Pulse: 66  Weight: 173 lb 9.6 oz (78.7 kg)    Fetal Status: Fetal Heart Rate (bpm): 156   Movement: Present     General:  Alert, oriented and cooperative. Patient is in no acute distress.  Skin: Skin is warm and dry. No rash noted.   Cardiovascular: Normal heart rate noted  Respiratory: Normal respiratory effort, no problems with respiration noted  Abdomen: Soft, gravid, appropriate for gestational age. Pain/Pressure: Absent     Pelvic: Vag. Bleeding: None Vag D/C Character: Watery   Cervical exam deferred        Extremities: Normal range of motion.  Edema: None  Mental Status: Normal mood and affect. Normal behavior. Normal judgment and thought content.   Urinalysis:      Assessment and Plan:  Pregnancy: G4P1021 at [redacted]w[redacted]d  1. Supervision of high risk pregnancy, antepartum BP and FHR normal Swabs today Very uncomfortable, discussed natural induction methods and possible elective induction - GC/Chlamydia probe amp ([place])not at [facility] - Culture,  beta strep (group b only)  2. Multigravida of advanced maternal age in third trimester Start testing today with MFM, will follow with our clinic after that  3. Gestational diabetes mellitus (GDM) in third trimester, gestational diabetes method of control unspecified Very well controlled Has growth scan later today  4. Unwanted fertility Signed papers 10/22/21  Preterm labor symptoms and general obstetric precautions including but not limited to vaginal bleeding, contractions, leaking of fluid and fetal movement were reviewed in detail with the patient. Please refer to After Visit Summary for other counseling recommendations.  Return in 1 week (on 12/16/2021) for Dyad patient, ob visit.   Clarnce Flock, MD

## 2021-12-10 LAB — GC/CHLAMYDIA PROBE AMP (~~LOC~~) NOT AT ARMC
Chlamydia: NEGATIVE
Comment: NEGATIVE
Comment: NORMAL
Neisseria Gonorrhea: NEGATIVE

## 2021-12-12 ENCOUNTER — Encounter: Payer: Self-pay | Admitting: Family Medicine

## 2021-12-12 DIAGNOSIS — O99820 Streptococcus B carrier state complicating pregnancy: Secondary | ICD-10-CM | POA: Insufficient documentation

## 2021-12-12 LAB — CULTURE, BETA STREP (GROUP B ONLY): Strep Gp B Culture: POSITIVE — AB

## 2021-12-16 ENCOUNTER — Other Ambulatory Visit: Payer: Self-pay

## 2021-12-16 ENCOUNTER — Other Ambulatory Visit: Payer: Self-pay | Admitting: Family Medicine

## 2021-12-16 ENCOUNTER — Other Ambulatory Visit (HOSPITAL_COMMUNITY)
Admission: RE | Admit: 2021-12-16 | Discharge: 2021-12-16 | Disposition: A | Discharge status: Home / Self Care | Payer: Medicaid Other | Source: Ambulatory Visit | Attending: Family Medicine | Admitting: Family Medicine

## 2021-12-16 ENCOUNTER — Telehealth (HOSPITAL_COMMUNITY): Payer: Self-pay | Admitting: *Deleted

## 2021-12-16 ENCOUNTER — Ambulatory Visit (INDEPENDENT_AMBULATORY_CARE_PROVIDER_SITE_OTHER): Payer: Medicaid Other | Admitting: Family Medicine

## 2021-12-16 VITALS — BP 113/77 | HR 88 | Wt 173.6 lb

## 2021-12-16 DIAGNOSIS — O09523 Supervision of elderly multigravida, third trimester: Secondary | ICD-10-CM

## 2021-12-16 DIAGNOSIS — O99820 Streptococcus B carrier state complicating pregnancy: Secondary | ICD-10-CM

## 2021-12-16 DIAGNOSIS — O0990 Supervision of high risk pregnancy, unspecified, unspecified trimester: Secondary | ICD-10-CM

## 2021-12-16 DIAGNOSIS — Z3009 Encounter for other general counseling and advice on contraception: Secondary | ICD-10-CM

## 2021-12-16 DIAGNOSIS — O24419 Gestational diabetes mellitus in pregnancy, unspecified control: Secondary | ICD-10-CM

## 2021-12-16 NOTE — Progress Notes (Signed)
Subjective:  Crystal Boyd is a 41 y.o. [id number] at [redacted]w[redacted]d being seen today for ongoing prenatal care.  She is currently monitored for the following issues for this high-risk pregnancy and has Supervision of high risk pregnancy, antepartum; Subchorionic hemorrhage, antepartum; Heart murmur; AMA (advanced maternal age) multigravida 32+; History of miscarriage, currently pregnant, first trimester; Alpha thalassemia silent carrier; Unwanted fertility; GDM (gestational diabetes mellitus); and GBS (group B Streptococcus carrier), +RV culture, currently pregnant on their problem list.  Patient reports  vaginal discharge, slight odor .  Contractions: Irritability. Vag. Bleeding: None.  Movement: Present. Denies leaking of fluid.   The following portions of the patient's history were reviewed and updated as appropriate: allergies, current medications, past family history, past medical history, past social history, past surgical history and problem list. Problem list updated.  Objective:   Vitals:   12/16/21 1124  BP: 113/77  Pulse: 88  Weight: 173 lb 9.6 oz (78.7 kg)    Fetal Status: Fetal Heart Rate (bpm): 147   Movement: Present  Presentation: Vertex  General:  Alert, oriented and cooperative. Patient is in no acute distress.  Skin: Skin is warm and dry. No rash noted.   Cardiovascular: Normal heart rate noted  Respiratory: Normal respiratory effort, no problems with respiration noted  Abdomen: Soft, gravid, appropriate for gestational age. Pain/Pressure: Present     Pelvic: Vag. Bleeding: None Vag D/C Character: Watery   Cervical exam performed Dilation: 1 Effacement (%): Thick Station: -3  Extremities: Normal range of motion.  Edema: None  Mental Status: Normal mood and affect. Normal behavior. Normal judgment and thought content.   Urinalysis:      Assessment and Plan:  Pregnancy: G4P1021 at [redacted]w[redacted]d  1. Supervision of high risk pregnancy, antepartum BP and FHR normal Swab for vaginal  discharge/odor Cervix checked, still 1 and thick, discussed membrane sweep next visit if desired She is still doing traditional methods (dates, raspberry leaf tea, etc.) Long discussion with patient regarding GBS, necessity of IV during labor We reviewed that patient always has bodily autonomy and does not have to have an IV if she does not want one I reviewed that it would be quite challenging to administer GBS abx without an IV, and she reported she was not sure if she wanted to do that regardless We reviewed that the treatment is highly effective at preventing GBS and I strongly recommend to do so during labor, though risks are admittedly low Also discussed the unpredictable nature of L&D and having an IV can be very useful in that scenario Again reiterated she is free to make an informed decision and we will support it She will continue to consider - Cervicovaginal ancillary only( [place])  2. Multigravida of advanced maternal age in third trimester BPP scheduled for later this week Scheduled today for IOL at 39 wks Form faxed, orders placed  3. Gestational diabetes mellitus (GDM) in third trimester, gestational diabetes method of control unspecified Excellent control, only one sugar elevated, postprandial at 127 Last growth US shows EFW 3573g, 98% on 12/09/2020  4. Unwanted fertility Tubal papers signed previously  5. GBS (group B Streptococcus carrier), +RV culture, currently pregnant Inadvertently sent without reflex sensitivities Will contact [facility] to see if we can run them at this point, if not will recollect at upcoming BPP appt  Term labor symptoms and general obstetric precautions including but not limited to vaginal bleeding, contractions, leaking of fluid and fetal movement were reviewed in detail with the patient.  Please refer to After Visit Summary for other counseling recommendations.  Return in 1 week (on 12/23/2021) for Dyad patient, ob visit.   Clarnce Flock, MD

## 2021-12-16 NOTE — Telephone Encounter (Signed)
Preadmission screen  

## 2021-12-16 NOTE — Patient Instructions (Signed)

## 2021-12-17 ENCOUNTER — Telehealth (HOSPITAL_COMMUNITY): Payer: Self-pay | Admitting: *Deleted

## 2021-12-17 LAB — CERVICOVAGINAL ANCILLARY ONLY
Bacterial Vaginitis (gardnerella): NEGATIVE
Candida Glabrata: NEGATIVE
Candida Vaginitis: NEGATIVE
Comment: NEGATIVE
Comment: NEGATIVE
Comment: NEGATIVE

## 2021-12-17 NOTE — Telephone Encounter (Signed)
Preadmission screen  

## 2021-12-18 ENCOUNTER — Other Ambulatory Visit: Payer: Self-pay

## 2021-12-18 ENCOUNTER — Ambulatory Visit (INDEPENDENT_AMBULATORY_CARE_PROVIDER_SITE_OTHER): Payer: Medicaid Other

## 2021-12-18 ENCOUNTER — Ambulatory Visit (INDEPENDENT_AMBULATORY_CARE_PROVIDER_SITE_OTHER): Payer: Medicaid Other | Admitting: General Practice

## 2021-12-18 VITALS — BP 122/74 | HR 87

## 2021-12-18 DIAGNOSIS — O09523 Supervision of elderly multigravida, third trimester: Secondary | ICD-10-CM

## 2021-12-18 DIAGNOSIS — Z3A37 37 weeks gestation of pregnancy: Secondary | ICD-10-CM | POA: Diagnosis not present

## 2021-12-18 NOTE — Progress Notes (Signed)
Pt informed that the ultrasound is considered a limited OB ultrasound and is not intended to be a complete ultrasound exam.  Patient also informed that the ultrasound is not being completed with the intent of assessing for fetal or placental anomalies or any pelvic abnormalities.  Explained that the purpose of today’s ultrasound is to assess for  BPP, presentation, and AFI.  Patient acknowledges the purpose of the exam and the limitations of the study.    ° °Iliany Losier H RN BSN °12/18/21 ° °

## 2021-12-24 ENCOUNTER — Telehealth (HOSPITAL_COMMUNITY): Payer: Self-pay | Admitting: *Deleted

## 2021-12-24 ENCOUNTER — Other Ambulatory Visit: Payer: Self-pay

## 2021-12-24 ENCOUNTER — Ambulatory Visit (INDEPENDENT_AMBULATORY_CARE_PROVIDER_SITE_OTHER): Payer: Medicaid Other | Admitting: Family Medicine

## 2021-12-24 ENCOUNTER — Encounter (HOSPITAL_COMMUNITY): Payer: Self-pay | Admitting: *Deleted

## 2021-12-24 VITALS — BP 121/82 | HR 98 | Wt 172.3 lb

## 2021-12-24 DIAGNOSIS — R011 Cardiac murmur, unspecified: Secondary | ICD-10-CM

## 2021-12-24 DIAGNOSIS — O99820 Streptococcus B carrier state complicating pregnancy: Secondary | ICD-10-CM

## 2021-12-24 DIAGNOSIS — O0990 Supervision of high risk pregnancy, unspecified, unspecified trimester: Secondary | ICD-10-CM | POA: Diagnosis not present

## 2021-12-24 DIAGNOSIS — O468X9 Other antepartum hemorrhage, unspecified trimester: Secondary | ICD-10-CM

## 2021-12-24 DIAGNOSIS — O418X90 Other specified disorders of amniotic fluid and membranes, unspecified trimester, not applicable or unspecified: Secondary | ICD-10-CM

## 2021-12-24 DIAGNOSIS — O09523 Supervision of elderly multigravida, third trimester: Secondary | ICD-10-CM

## 2021-12-24 DIAGNOSIS — Z3009 Encounter for other general counseling and advice on contraception: Secondary | ICD-10-CM

## 2021-12-24 DIAGNOSIS — O24419 Gestational diabetes mellitus in pregnancy, unspecified control: Secondary | ICD-10-CM

## 2021-12-24 LAB — OB RESULTS CONSOLE GC/CHLAMYDIA: Gonorrhea: NEGATIVE

## 2021-12-24 NOTE — Telephone Encounter (Signed)
Preadmission screen  

## 2021-12-24 NOTE — Progress Notes (Signed)
° ° °  Subjective:  Crystal Boyd is a 41 y.o. [id number] at [redacted]w[redacted]d being seen today for ongoing prenatal care.  She is currently monitored for the following issues for this high-risk pregnancy and has Supervision of high risk pregnancy, antepartum; Subchorionic hemorrhage, antepartum; Heart murmur; AMA (advanced maternal age) multigravida 35+; History of miscarriage, currently pregnant, first trimester; Alpha thalassemia silent carrier; Unwanted fertility; GDM (gestational diabetes mellitus); and GBS (group B Streptococcus carrier), +RV culture, currently pregnant on their problem list.  Patient reports no complaints.  Contractions: Irritability. Vag. Bleeding: None.  Movement: Present. Denies leaking of fluid.   The following portions of the patient's history were reviewed and updated as appropriate: allergies, current medications, past family history, past medical history, past social history, past surgical history and problem list. Problem list updated.  Objective:   Vitals:   12/24/21 0946  BP: 121/82  Pulse: 98  Weight: 172 lb 4.8 oz (78.2 kg)    Fetal Status: Fetal Heart Rate (bpm): 147   Movement: Present     General:  Alert, oriented and cooperative. Patient is in no acute distress.  Skin: Skin is warm and dry. No rash noted.   Cardiovascular: Normal heart rate noted  Respiratory: Normal respiratory effort, no problems with respiration noted  Abdomen: Soft, gravid, appropriate for gestational age. Pain/Pressure: Present     Pelvic: Vag. Bleeding: None     Cervical exam performed Dilation: 1 Effacement (%): Thick Station: -3  Extremities: Normal range of motion.  Edema: Mild pitting, slight indentation  Mental Status: Normal mood and affect. Normal behavior. Normal judgment and thought content.   Urinalysis:      Assessment and Plan:  Pregnancy: G4P1021 at [redacted]w[redacted]d  1. Supervision of high risk pregnancy, antepartum BP and FHR normal Membranes attempted to be stripped, very  difficult/posterior exam, able to reach internal os but not strip membranes  2. Gestational diabetes mellitus (GDM) in third trimester, gestational diabetes method of control unspecified Did not bring log but on recall is well controlled  3. Multigravida of advanced maternal age in third trimester Antenatal testing reassuring to date Has BPP scheduled for tomorrow Scheduled for IOL on 12/30/2021 at 39 weeks  4. Unwanted fertility Tubal papers signed 10/22/2022  5. GBS (group B Streptococcus carrier), +RV culture, currently pregnant Recollected today to get sensitivities   Term labor symptoms and general obstetric precautions including but not limited to vaginal bleeding, contractions, leaking of fluid and fetal movement were reviewed in detail with the patient. Please refer to After Visit Summary for other counseling recommendations.  Return in about 7 weeks (around 02/11/2022) for Dyad patient, PP check.   Venora Maples, MD

## 2021-12-25 ENCOUNTER — Ambulatory Visit (INDEPENDENT_AMBULATORY_CARE_PROVIDER_SITE_OTHER): Payer: Medicaid Other

## 2021-12-25 ENCOUNTER — Ambulatory Visit (INDEPENDENT_AMBULATORY_CARE_PROVIDER_SITE_OTHER): Payer: Medicaid Other | Admitting: General Practice

## 2021-12-25 VITALS — BP 129/80 | HR 89

## 2021-12-25 DIAGNOSIS — O09523 Supervision of elderly multigravida, third trimester: Secondary | ICD-10-CM

## 2021-12-25 LAB — OB RESULTS CONSOLE GBS: GBS: POSITIVE

## 2021-12-25 NOTE — Progress Notes (Signed)
Pt informed that the ultrasound is considered a limited OB ultrasound and is not intended to be a complete ultrasound exam.  Patient also informed that the ultrasound is not being completed with the intent of assessing for fetal or placental anomalies or any pelvic abnormalities.  Explained that the purpose of today’s ultrasound is to assess for  BPP, presentation, and AFI.  Patient acknowledges the purpose of the exam and the limitations of the study.    ° °Erol Flanagin H RN BSN °12/25/21 ° °

## 2021-12-26 NOTE — Progress Notes (Signed)
Attestation of Attending Supervision of clinical support staff: I agree with the care provided to this patient and was available for any consultation.  I have reviewed the RN's note and chart. I was available for consult and to see the patient if needed.   Nedra Mcinnis MD MPH Attending Physician Faculty Practice- [facility]  

## 2021-12-28 LAB — STREP GP B CULTURE+RFLX: Strep Gp B Culture+Rflx: NEGATIVE

## 2021-12-30 ENCOUNTER — Encounter (HOSPITAL_COMMUNITY): Payer: Self-pay | Admitting: Family Medicine

## 2021-12-30 ENCOUNTER — Other Ambulatory Visit: Payer: Self-pay

## 2021-12-30 ENCOUNTER — Inpatient Hospital Stay (HOSPITAL_COMMUNITY)
Admission: AD | Admit: 2021-12-30 | Discharge: 2022-01-02 | DRG: 798 | Disposition: A | Discharge status: Home / Self Care | Payer: Medicaid Other | Attending: Obstetrics and Gynecology | Admitting: Obstetrics and Gynecology

## 2021-12-30 ENCOUNTER — Inpatient Hospital Stay (HOSPITAL_COMMUNITY): Payer: Medicaid Other

## 2021-12-30 DIAGNOSIS — O3663X0 Maternal care for excessive fetal growth, third trimester, not applicable or unspecified: Secondary | ICD-10-CM | POA: Diagnosis present

## 2021-12-30 DIAGNOSIS — D563 Thalassemia minor: Secondary | ICD-10-CM | POA: Diagnosis present

## 2021-12-30 DIAGNOSIS — O134 Gestational [pregnancy-induced] hypertension without significant proteinuria, complicating childbirth: Secondary | ICD-10-CM | POA: Diagnosis not present

## 2021-12-30 DIAGNOSIS — O09523 Supervision of elderly multigravida, third trimester: Secondary | ICD-10-CM | POA: Diagnosis present

## 2021-12-30 DIAGNOSIS — O99893 Other specified diseases and conditions complicating puerperium: Secondary | ICD-10-CM | POA: Diagnosis not present

## 2021-12-30 DIAGNOSIS — O99820 Streptococcus B carrier state complicating pregnancy: Secondary | ICD-10-CM | POA: Diagnosis not present

## 2021-12-30 DIAGNOSIS — Z880 Allergy status to penicillin: Secondary | ICD-10-CM

## 2021-12-30 DIAGNOSIS — R531 Weakness: Secondary | ICD-10-CM | POA: Diagnosis not present

## 2021-12-30 DIAGNOSIS — O24420 Gestational diabetes mellitus in childbirth, diet controlled: Principal | ICD-10-CM | POA: Diagnosis present

## 2021-12-30 DIAGNOSIS — Z3009 Encounter for other general counseling and advice on contraception: Secondary | ICD-10-CM | POA: Diagnosis present

## 2021-12-30 DIAGNOSIS — M21372 Foot drop, left foot: Secondary | ICD-10-CM | POA: Diagnosis not present

## 2021-12-30 DIAGNOSIS — O418X90 Other specified disorders of amniotic fluid and membranes, unspecified trimester, not applicable or unspecified: Secondary | ICD-10-CM

## 2021-12-30 DIAGNOSIS — R200 Anesthesia of skin: Secondary | ICD-10-CM | POA: Diagnosis not present

## 2021-12-30 DIAGNOSIS — R030 Elevated blood-pressure reading, without diagnosis of hypertension: Secondary | ICD-10-CM | POA: Diagnosis not present

## 2021-12-30 DIAGNOSIS — Z20822 Contact with and (suspected) exposure to covid-19: Secondary | ICD-10-CM | POA: Diagnosis present

## 2021-12-30 DIAGNOSIS — Z7982 Long term (current) use of aspirin: Secondary | ICD-10-CM

## 2021-12-30 DIAGNOSIS — O99824 Streptococcus B carrier state complicating childbirth: Secondary | ICD-10-CM | POA: Diagnosis not present

## 2021-12-30 DIAGNOSIS — Z302 Encounter for sterilization: Secondary | ICD-10-CM | POA: Diagnosis not present

## 2021-12-30 DIAGNOSIS — R011 Cardiac murmur, unspecified: Secondary | ICD-10-CM

## 2021-12-30 DIAGNOSIS — O0990 Supervision of high risk pregnancy, unspecified, unspecified trimester: Secondary | ICD-10-CM

## 2021-12-30 DIAGNOSIS — Z3A39 39 weeks gestation of pregnancy: Secondary | ICD-10-CM | POA: Diagnosis not present

## 2021-12-30 DIAGNOSIS — Z9851 Tubal ligation status: Secondary | ICD-10-CM

## 2021-12-30 DIAGNOSIS — O24419 Gestational diabetes mellitus in pregnancy, unspecified control: Secondary | ICD-10-CM | POA: Diagnosis present

## 2021-12-30 LAB — COMPREHENSIVE METABOLIC PANEL
ALT: 16 U/L (ref 0–44)
AST: 19 U/L (ref 15–41)
Albumin: 2.8 g/dL — ABNORMAL LOW (ref 3.5–5.0)
Alkaline Phosphatase: 110 U/L (ref 38–126)
Anion gap: 8 (ref 5–15)
BUN: 10 mg/dL (ref 6–20)
CO2: 24 mmol/L (ref 22–32)
Calcium: 9 mg/dL (ref 8.9–10.3)
Chloride: 104 mmol/L (ref 98–111)
Creatinine, Ser: 0.64 mg/dL (ref 0.44–1.00)
GFR, Estimated: >60 mL/min (ref >60)
Glucose, Bld: 99 mg/dL (ref 70–99)
Potassium: 3.8 mmol/L (ref 3.5–5.1)
Sodium: 136 mmol/L (ref 135–145)
Total Bilirubin: 0.7 mg/dL (ref 0.3–1.2)
Total Protein: 5.9 g/dL — ABNORMAL LOW (ref 6.5–8.1)

## 2021-12-30 LAB — CBC
HCT: 30.7 % — ABNORMAL LOW (ref 36.0–46.0)
Hemoglobin: 9.9 g/dL — ABNORMAL LOW (ref 12.0–15.0)
MCH: 25.6 pg — ABNORMAL LOW (ref 26.0–34.0)
MCHC: 32.2 g/dL (ref 30.0–36.0)
MCV: 79.5 fL — ABNORMAL LOW (ref 80.0–100.0)
Platelets: 228 10*3/uL (ref 150–400)
RBC: 3.86 MIL/uL — ABNORMAL LOW (ref 3.87–5.11)
RDW: 14.2 % (ref 11.5–15.5)
WBC: 7.7 10*3/uL (ref 4.0–10.5)
nRBC: 0 % (ref 0.0–0.2)

## 2021-12-30 LAB — PROTEIN / CREATININE RATIO, URINE
Creatinine, Urine: 110.92 mg/dL
Protein Creatinine Ratio: 0.12 mg/mg{Cre} (ref 0.00–0.15)
Total Protein, Urine: 13 mg/dL

## 2021-12-30 LAB — TYPE AND SCREEN
ABO/RH(D): AB POS
Antibody Screen: NEGATIVE

## 2021-12-30 LAB — RESP PANEL BY RT-PCR (FLU A&B, COVID) ARPGX2
Influenza A by PCR: NEGATIVE
Influenza B by PCR: NEGATIVE
SARS Coronavirus 2 by RT PCR: NEGATIVE

## 2021-12-30 LAB — RPR: RPR Ser Ql: NONREACTIVE

## 2021-12-30 LAB — GLUCOSE, CAPILLARY
Glucose-Capillary: 107 mg/dL — ABNORMAL HIGH (ref 70–99)
Glucose-Capillary: 72 mg/dL (ref 70–99)
Glucose-Capillary: 75 mg/dL (ref 70–99)
Glucose-Capillary: 72 mg/dL (ref 70–99)

## 2021-12-30 MED ORDER — CEFAZOLIN SODIUM-DEXTROSE 1-4 GM/50ML-% IV SOLN
1.0000 g | Freq: Three times a day (TID) | INTRAVENOUS | Status: DC
  Administered 2021-12-30 – 2021-12-31 (×3): 1 g via INTRAVENOUS
  Filled 2021-12-30 (×5): qty 50

## 2021-12-30 MED ORDER — LACTATED RINGERS IV SOLN
INTRAVENOUS | Status: DC
  Administered 2021-12-31: 125 mL/h via INTRAVENOUS

## 2021-12-30 MED ORDER — FENTANYL-BUPIVACAINE-NACL 0.5-0.125-0.9 MG/250ML-% EP SOLN
12.0000 mL/h | EPIDURAL | Status: DC | PRN
  Administered 2021-12-31: 12 mL/h via EPIDURAL

## 2021-12-30 MED ORDER — LACTATED RINGERS IV SOLN: 500.0000 mL | Freq: Once | INTRAVENOUS | Status: DC

## 2021-12-30 MED ORDER — HYDROXYZINE HCL 50 MG PO TABS: 50.0000 mg | ORAL_TABLET | Freq: Four times a day (QID) | ORAL | Status: DC | PRN

## 2021-12-30 MED ORDER — LACTATED RINGERS IV SOLN: 500.0000 mL | INTRAVENOUS | Status: DC | PRN

## 2021-12-30 MED ORDER — ACETAMINOPHEN 325 MG PO TABS: 650.0000 mg | ORAL_TABLET | ORAL | Status: DC | PRN

## 2021-12-30 MED ORDER — EPHEDRINE 5 MG/ML INJ: 10.0000 mg | INTRAVENOUS | Status: DC | PRN

## 2021-12-30 MED ORDER — SOD CITRATE-CITRIC ACID 500-334 MG/5ML PO SOLN: 30.0000 mL | ORAL | Status: DC | PRN

## 2021-12-30 MED ORDER — OXYCODONE-ACETAMINOPHEN 5-325 MG PO TABS: 2.0000 | ORAL_TABLET | ORAL | Status: DC | PRN

## 2021-12-30 MED ORDER — FENTANYL-BUPIVACAINE-NACL 0.5-0.125-0.9 MG/250ML-% EP SOLN
EPIDURAL | Status: AC
  Filled 2021-12-30: qty 250

## 2021-12-30 MED ORDER — TERBUTALINE SULFATE 1 MG/ML IJ SOLN
0.2500 mg | Freq: Once | INTRAMUSCULAR | Status: AC | PRN
  Administered 2021-12-30: 0.25 mg via SUBCUTANEOUS
  Filled 2021-12-30: qty 1

## 2021-12-30 MED ORDER — FLEET ENEMA 7-19 GM/118ML RE ENEM: 1.0000 | ENEMA | Freq: Every day | RECTAL | Status: DC | PRN

## 2021-12-30 MED ORDER — DIPHENHYDRAMINE HCL 50 MG/ML IJ SOLN: 12.5000 mg | INTRAMUSCULAR | Status: DC | PRN

## 2021-12-30 MED ORDER — FENTANYL CITRATE (PF) 100 MCG/2ML IJ SOLN
50.0000 ug | INTRAMUSCULAR | Status: DC | PRN
  Filled 2021-12-30: qty 2

## 2021-12-30 MED ORDER — OXYTOCIN BOLUS FROM INFUSION: 333.0000 mL | Freq: Once | INTRAVENOUS | Status: DC

## 2021-12-30 MED ORDER — CEFAZOLIN SODIUM-DEXTROSE 2-4 GM/100ML-% IV SOLN
2.0000 g | Freq: Once | INTRAVENOUS | Status: AC
  Administered 2021-12-30: 2 g via INTRAVENOUS
  Filled 2021-12-30: qty 100

## 2021-12-30 MED ORDER — PHENYLEPHRINE 40 MCG/ML (10ML) SYRINGE FOR IV PUSH (FOR BLOOD PRESSURE SUPPORT): 80.0000 ug | PREFILLED_SYRINGE | INTRAVENOUS | Status: DC | PRN

## 2021-12-30 MED ORDER — FENTANYL CITRATE (PF) 100 MCG/2ML IJ SOLN
100.0000 ug | INTRAMUSCULAR | Status: DC | PRN
  Administered 2021-12-30 – 2021-12-31 (×4): 100 ug via INTRAVENOUS
  Filled 2021-12-30 (×3): qty 2

## 2021-12-30 MED ORDER — OXYTOCIN-SODIUM CHLORIDE 30-0.9 UT/500ML-% IV SOLN: 2.5000 [IU]/h | INTRAVENOUS | Status: DC

## 2021-12-30 MED ORDER — ZOLPIDEM TARTRATE 5 MG PO TABS: 5.0000 mg | ORAL_TABLET | Freq: Every evening | ORAL | Status: DC | PRN

## 2021-12-30 MED ORDER — LIDOCAINE HCL (PF) 1 % IJ SOLN: 30.0000 mL | INTRAMUSCULAR | Status: DC | PRN

## 2021-12-30 MED ORDER — OXYCODONE-ACETAMINOPHEN 5-325 MG PO TABS: 1.0000 | ORAL_TABLET | ORAL | Status: DC | PRN

## 2021-12-30 MED ORDER — FENTANYL CITRATE (PF) 100 MCG/2ML IJ SOLN: 50.0000 ug | INTRAMUSCULAR | Status: DC | PRN

## 2021-12-30 MED ORDER — ONDANSETRON HCL 4 MG/2ML IJ SOLN
4.0000 mg | Freq: Four times a day (QID) | INTRAMUSCULAR | Status: DC | PRN
  Administered 2021-12-30: 4 mg via INTRAVENOUS

## 2021-12-30 MED ORDER — ONDANSETRON HCL 4 MG/2ML IJ SOLN
4.0000 mg | Freq: Four times a day (QID) | INTRAMUSCULAR | Status: DC | PRN
  Filled 2021-12-30: qty 2

## 2021-12-30 MED ORDER — OXYTOCIN BOLUS FROM INFUSION
333.0000 mL | Freq: Once | INTRAVENOUS | Status: AC
  Administered 2021-12-31: 333 mL via INTRAVENOUS

## 2021-12-30 MED ORDER — LACTATED RINGERS IV SOLN: INTRAVENOUS | Status: DC

## 2021-12-30 MED ORDER — MISOPROSTOL 25 MCG QUARTER TABLET
25.0000 ug | ORAL_TABLET | ORAL | Status: DC | PRN
  Administered 2021-12-30 (×3): 25 ug via VAGINAL
  Filled 2021-12-30 (×3): qty 1

## 2021-12-30 MED ORDER — OXYTOCIN-SODIUM CHLORIDE 30-0.9 UT/500ML-% IV SOLN
2.5000 [IU]/h | INTRAVENOUS | Status: DC
  Filled 2021-12-30: qty 500

## 2021-12-30 MED ORDER — SOD CITRATE-CITRIC ACID 500-334 MG/5ML PO SOLN
30.0000 mL | ORAL | Status: DC | PRN
  Administered 2021-12-31: 30 mL via ORAL
  Filled 2021-12-30: qty 30

## 2021-12-30 NOTE — Progress Notes (Signed)
Labor Progress Note Crystal Boyd is a 41 y.o. [id number] at [redacted]w[redacted]d presented for IOL d/t A1GDM S: Patient is resting comfortably, says her pain is currently controlled and she has been moving a lot as well.  O:  BP 123/67    Pulse 73    Temp 98.1 F (36.7 C) (Oral)    Resp 16    Ht 5\' 4"  (1.626 m)    Wt 79.6 kg    LMP 04/01/2021    BMI 30.12 kg/m  EFM: FHR baseline 140s/moderate variability/15x15 accelerations, no decels Gen: NAD, laying in bed comfortably Resp: No iWOB CVE: Dilation: 1 Effacement (%): Thick Station: Ballotable Presentation: Vertex Exam by:: Sports coach   A&P: 75 y.o. [id number] [redacted]w[redacted]d  here for IOL d/t A1GDM #Labor: Progressing, cervix changed up to 1 cm now. 2nd cytotec given at 2:10. -Can consider foley bulb placement  #Pain: Plan for epidural #FWB: Cat 1  #GBS positive -Ancef  #A1GDM CBGs 107>72  [name], MD [facility] PGY-1 [facility] [place] 2:31 PM

## 2021-12-30 NOTE — H&P (Addendum)
OBSTETRIC ADMISSION HISTORY AND PHYSICAL  Crystal Boyd is a 41 y.o. female [phone] with IUP at 31w0dby LMP presenting for IOL d/t A1GDM. She reports +FMs, No LOF, no VB, no blurry vision, headaches or peripheral edema, and RUQ pain.  She plans on breast and bottle feeding. She requests BTL for birth control. 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 --Unwanted fertility --GBS+  Past Medical History: Past Medical History:  Diagnosis Date   Anemia    Gestational diabetes    Heart murmur     Past Surgical History: Past Surgical History:  Procedure Laterality Date   HYSTEROSCOPY  2014   thinks had few months after miscarriage   POLYPECTOMY  2020   removed polyp in uterus-[facility]    Obstetrical History: OB History     Gravida  4   Para  1   Term  1   Preterm  0   AB  2   Living  1      SAB  2   IAB  0   Ectopic  0   Multiple  0   Live Births  1           Social History Social History   Socioeconomic History   Marital status: Married    Spouse name: Not on file   Number of children: Not on file   Years of education: Not on file   Highest education level: Not on file  Occupational History   Not on file  Tobacco Use   Smoking status: Never   Smokeless tobacco: Never  Vaping Use   Vaping Use: Never used  Substance and Sexual Activity   Alcohol use: No    Alcohol/week: 0.0 standard drinks   Drug use: No   Sexual activity: Yes    Birth control/protection: None  Other Topics Concern   Not on file  Social History Narrative   Not on file   Social Determinants of Health   Financial Resource Strain: Not on file   Food Insecurity: No Food Insecurity   Worried About Running Out of Food in the Last Year: Never true   Ran Out of Food in the Last Year: Never true  Transportation Needs: No Transportation Needs   Lack of Transportation (Medical): No   Lack of Transportation (Non-Medical): No  Physical Activity: Not on file  Stress: Not on file  Social Connections: Not on file    Family History: Family History  Problem Relation Age of Onset   Kidney disease Mother    Hypertension Mother    Stroke Mother     Allergies: Allergies  Allergen Reactions   Penicillins Shortness Of Breath    Pt denies allergies to latex, iodine, or shellfish.  Medications Prior to Admission  Medication Sig Dispense Refill Last Dose   Accu-Chek Softclix Lancets lancets Use four times daily as instructed. 100 each 12    aspirin EC 81 MG tablet Take 1 tablet (81 mg total) by mouth daily. Swallow whole. 30 tablet 11    Blood Glucose Monitoring Suppl (ACCU-CHEK GUIDE) w/Device KIT 1 Device by Does not apply route in the morning, at noon, in the evening, and at bedtime. 1 kit 0  Blood Pressure Monitoring (BLOOD PRESSURE KIT) DEVI 1 Device by Does not apply route as needed. 1 each 0    glucose blood test strip Use as instructed 100 each 12    Prenatal Vit-Fe Fumarate-FA (PRENATAL PO) Take by mouth.        Review of Systems   All systems reviewed and negative except as stated in HPI  Blood pressure 121/71, pulse 90, temperature 98.2 F (36.8 C), temperature source Oral, resp. rate 16, height '5\' 4"'  (1.626 m), weight 79.6 kg, last menstrual period 04/01/2021. General appearance: alert and no distress Lungs: clear to auscultation bilaterally Heart: regular rate and rhythm Abdomen: non-tender Extremities: Homans sign is negative, no sign of DVT Presentation:  vertex Fetal monitoringBaseline: 150 bpm moderate variability Uterine activityFrequency: Every 10 minutes Dilation: Fingertip Effacement (%): Thick Exam  by:: Lesia Sago RN  Prenatal labs: ABO, Rh: --/--/PENDING (02/21 4098) Antibody: PENDING (02/21 0815) Rubella: 16.30 (08/10 1558) RPR: Non Reactive (12/14 0859)  HBsAg: Negative (08/10 1558)  HIV: Non Reactive (12/14 0859)  GBS: Positive/-- (02/16 0000)  1 hr Glucola: 199, 2 hr 150 Genetic screening:  panorama low risk; horizon silent alpha thalassemia carrier, AFP negative Anatomy US: Normal @ 14 weeks  Prenatal Transfer Tool  Maternal Diabetes: Yes:  Diabetes Type:  Diet controlled Genetic Screening: Normal Maternal Ultrasounds/Referrals: Normal Fetal Ultrasounds or other Referrals:  Referred to Materal Fetal Medicine  for growth scans Maternal Substance Abuse:  No Significant Maternal Medications:  None Significant Maternal Lab Results: Group B Strep positive  Results for orders placed or performed during the hospital encounter of 12/30/21 (from the past 24 hour(s))  Glucose, capillary   Collection Time: 12/30/21  8:01 AM  Result Value Ref Range   Glucose-Capillary 107 (H) 70 - 99 mg/dL  CBC   Collection Time: 12/30/21  8:15 AM  Result Value Ref Range   WBC 7.7 4.0 - 10.5 K/uL   RBC 3.86 (L) 3.87 - 5.11 MIL/uL   Hemoglobin 9.9 (L) 12.0 - 15.0 g/dL   HCT 30.7 (L) 36.0 - 46.0 %   MCV 79.5 (L) 80.0 - 100.0 fL   MCH 25.6 (L) 26.0 - 34.0 pg   MCHC 32.2 30.0 - 36.0 g/dL   RDW 14.2 11.5 - 15.5 %   Platelets 228 150 - 400 K/uL   nRBC 0.0 0.0 - 0.2 %  Comprehensive metabolic panel   Collection Time: 12/30/21  8:15 AM  Result Value Ref Range   Sodium 136 135 - 145 mmol/L   Potassium 3.8 3.5 - 5.1 mmol/L   Chloride 104 98 - 111 mmol/L   CO2 24 22 - 32 mmol/L   Glucose, Bld 99 70 - 99 mg/dL   BUN 10 6 - 20 mg/dL   Creatinine, Ser 0.64 0.44 - 1.00 mg/dL   Calcium 9.0 8.9 - 10.3 mg/dL   Total Protein 5.9 (L) 6.5 - 8.1 g/dL   Albumin 2.8 (L) 3.5 - 5.0 g/dL   AST 19 15 - 41 U/L   ALT 16 0 - 44 U/L   Alkaline Phosphatase 110 38 - 126 U/L   Total Bilirubin 0.7 0.3 - 1.2 mg/dL    GFR, Estimated >60 >60 mL/min   Anion gap 8 5 - 15  Type and screen   Collection Time: 12/30/21  8:15 AM  Result Value Ref Range   ABO/RH(D) PENDING    Antibody Screen PENDING    Sample Expiration      01/02/2022,2359 Performed at [facility], [address], [place], [place] [address]     Patient Active Problem List   Diagnosis Date Noted   AMA (advanced maternal age) multigravida 25+, third trimester 12/30/2021   GBS (group B Streptococcus carrier), +RV culture, currently pregnant 12/12/2021   GDM (gestational diabetes mellitus) 10/24/2021   Unwanted fertility 10/22/2021   Alpha thalassemia silent carrier 07/18/2021   History of miscarriage, currently pregnant, first trimester 06/18/2021   Supervision of high risk pregnancy, antepartum 06/11/2021   Subchorionic hemorrhage, antepartum 06/11/2021   Heart murmur    AMA (advanced maternal age) multigravida 35+     Assessment/Plan:  Faren Belue is a 41 y.o. [id number] at 52w0dhere for IOL d/t A1GDM.  #Labor: IOL with vaginal cytotec #Pain: Plan for epidural #FWB: Category I #ID:  GBS+  #MOF: Breast #MOC: BTL #Circ:  Yes  #GBS Positive Culture positive 12/09/21 and 12/25/21. No sensitivities on cxs -Ancef due to PCN allergy  #A1GDM Glucose on admission was 99 -CBG q4h  MGerrit Heck MD  [facility] 12/30/2021, 9:05 AM    Attestation of Attending Supervision of Resident: Evaluation and management procedures were performed by the [facility] under my supervision. I was immediately available for direct supervision, assistance and direction throughout this encounter.  I also confirm that I have verified the information documented in the residents note, and that I have also personally reperformed the pertinent components of the physical exam and all of the medical decision making activities.    EKatherine Basset DLakes [facility] [employer] [facility] 12/30/2021  1:31 PM

## 2021-12-30 NOTE — Progress Notes (Addendum)
Labor Progress Note Crystal Boyd is a 41 y.o. [id number] at [redacted]w[redacted]d presented for IOL d/t A1GDM S: Patient is resting comfortably, was feeling some pains from contractions and wanted IV pain medication before foley insertion  O:  BP 136/71    Pulse 71    Temp 98.4 F (36.9 C) (Oral)    Resp 16    Ht 5\' 4"  (1.626 m)    Wt 79.6 kg    LMP 04/01/2021    BMI 30.12 kg/m  EFM: FHR baseline 140s/moderate variability/+accelerations, no decels Gen: NAD, laying in bed comfortably Resp: No iWOB CVE:  Dilation: 1.5 Effacement 70% Presentation: Vertex Exam by:: Booker, CNM   A&P: 41 y.o. 46 [redacted]w[redacted]d  here for IOL d/t A1GDM #Labor: Progressing, cervix changed up to 1.5 cm now. 3rd cytotec given at 1844. Foley bulb placed in room  #Pain: Plan for epidural, IV pain meds prn #FWB: Cat 1  #GBS positive -Ancef  #A1GDM CBGs 107>72>75  [redacted]w[redacted]d, MD [facility] PGY-1 Center for [facility] 6:46 PM

## 2021-12-30 NOTE — Progress Notes (Signed)
S: Patient reports ctxs getting more intense and wanting epidural.   O: Vitals:   12/30/21 1704 12/30/21 1842 12/30/21 1847 12/30/21 2256  BP: 117/66 136/71  131/66  Pulse: 77 71  84  Resp:    18  Temp:   98 F (36.7 C) 98.3 F (36.8 C)  TempSrc:   Oral Oral  Weight:      Height:        FHT:  FHR: 130 bpm, variability: moderate,  accelerations:  Present,  decelerations:  Present prolonged UC:   regular, every 1-4 minutes SVE:   Dilation: 5.5 Effacement (%): 70 Station: -2 Exam by:: Jordan Hawks, RN  A / P: 40 y.o. [id number] at [redacted]w[redacted]d here for IOL due to A1GDM and AMA  -Progressing well S/P foley balloon -A1GDM- CBG-72 -SROM @ 2117, clear fluid -GBS positive- Ancef -Terb given for prolonged decel after 4 ctxs back to back that has resolved with moderate variability and accels. Will continue to monitor  -Discussed expectant management unless unchanged cervix and will start lose dose Pitocin for augmentation and patient agrees with the plan.  Fetal Wellbeing:  Category II Pain Control:  Planning Epidural Anticipated MOD:  NSVD  Trellis Moment, SNM 12/30/2021, 11:51 PM

## 2021-12-31 ENCOUNTER — Inpatient Hospital Stay (HOSPITAL_COMMUNITY): Payer: Medicaid Other

## 2021-12-31 ENCOUNTER — Inpatient Hospital Stay (HOSPITAL_COMMUNITY): Payer: Medicaid Other | Admitting: Anesthesiology

## 2021-12-31 ENCOUNTER — Encounter (HOSPITAL_COMMUNITY): Payer: Self-pay | Admitting: Family Medicine

## 2021-12-31 ENCOUNTER — Encounter (HOSPITAL_COMMUNITY)
Admission: AD | Disposition: A | Discharge status: Home / Self Care | Payer: Self-pay | Source: Home / Self Care | Attending: Obstetrics and Gynecology

## 2021-12-31 ENCOUNTER — Other Ambulatory Visit: Payer: Self-pay

## 2021-12-31 ENCOUNTER — Other Ambulatory Visit: Payer: Medicaid Other

## 2021-12-31 DIAGNOSIS — Z9851 Tubal ligation status: Secondary | ICD-10-CM

## 2021-12-31 DIAGNOSIS — Z302 Encounter for sterilization: Secondary | ICD-10-CM | POA: Diagnosis not present

## 2021-12-31 DIAGNOSIS — O99820 Streptococcus B carrier state complicating pregnancy: Secondary | ICD-10-CM | POA: Diagnosis not present

## 2021-12-31 DIAGNOSIS — D649 Anemia, unspecified: Secondary | ICD-10-CM | POA: Diagnosis not present

## 2021-12-31 DIAGNOSIS — Z3A39 39 weeks gestation of pregnancy: Secondary | ICD-10-CM | POA: Diagnosis not present

## 2021-12-31 DIAGNOSIS — O24420 Gestational diabetes mellitus in childbirth, diet controlled: Secondary | ICD-10-CM | POA: Diagnosis not present

## 2021-12-31 DIAGNOSIS — O134 Gestational [pregnancy-induced] hypertension without significant proteinuria, complicating childbirth: Secondary | ICD-10-CM | POA: Diagnosis not present

## 2021-12-31 DIAGNOSIS — O24429 Gestational diabetes mellitus in childbirth, unspecified control: Secondary | ICD-10-CM | POA: Diagnosis not present

## 2021-12-31 DIAGNOSIS — O9902 Anemia complicating childbirth: Secondary | ICD-10-CM | POA: Diagnosis not present

## 2021-12-31 HISTORY — PX: TUBAL LIGATION: SHX77

## 2021-12-31 LAB — GLUCOSE, CAPILLARY
Glucose-Capillary: 102 mg/dL — ABNORMAL HIGH (ref 70–99)
Glucose-Capillary: 109 mg/dL — ABNORMAL HIGH (ref 70–99)
Glucose-Capillary: 132 mg/dL — ABNORMAL HIGH (ref 70–99)
Glucose-Capillary: 90 mg/dL (ref 70–99)
Glucose-Capillary: 98 mg/dL (ref 70–99)

## 2021-12-31 SURGERY — LIGATION, FALLOPIAN TUBE, POSTPARTUM
Anesthesia: Epidural

## 2021-12-31 MED ORDER — MISOPROSTOL 200 MCG PO TABS
ORAL_TABLET | ORAL | Status: AC
  Filled 2021-12-31: qty 5

## 2021-12-31 MED ORDER — ACETAMINOPHEN 160 MG/5ML PO SOLN: 325.0000 mg | ORAL | Status: DC | PRN

## 2021-12-31 MED ORDER — MEASLES, MUMPS & RUBELLA VAC IJ SOLR: 0.5000 mL | Freq: Once | INTRAMUSCULAR | Status: DC

## 2021-12-31 MED ORDER — FENTANYL CITRATE (PF) 100 MCG/2ML IJ SOLN: 25.0000 ug | INTRAMUSCULAR | Status: DC | PRN

## 2021-12-31 MED ORDER — LIDOCAINE HCL (PF) 1 % IJ SOLN
INTRAMUSCULAR | Status: DC | PRN
  Administered 2021-12-31: 10 mL via EPIDURAL

## 2021-12-31 MED ORDER — ACETAMINOPHEN 325 MG PO TABS
650.0000 mg | ORAL_TABLET | ORAL | Status: DC | PRN
  Administered 2022-01-01: 650 mg via ORAL
  Filled 2021-12-31: qty 2

## 2021-12-31 MED ORDER — TERBUTALINE SULFATE 1 MG/ML IJ SOLN: 0.2500 mg | Freq: Once | INTRAMUSCULAR | Status: DC | PRN

## 2021-12-31 MED ORDER — OXYCODONE HCL 5 MG PO TABS
5.0000 mg | ORAL_TABLET | Freq: Once | ORAL | Status: AC | PRN
  Administered 2021-12-31: 5 mg via ORAL

## 2021-12-31 MED ORDER — DIPHENHYDRAMINE HCL 25 MG PO CAPS: 25.0000 mg | ORAL_CAPSULE | Freq: Four times a day (QID) | ORAL | Status: DC | PRN

## 2021-12-31 MED ORDER — KETOROLAC TROMETHAMINE 30 MG/ML IJ SOLN
INTRAMUSCULAR | Status: DC | PRN
Start: 2021-12-31 — End: 2021-12-31
  Administered 2021-12-31: 30 mg via INTRAVENOUS

## 2021-12-31 MED ORDER — LIDOCAINE-EPINEPHRINE (PF) 1.5 %-1:200000 IJ SOLN
INTRAMUSCULAR | Status: DC | PRN
  Administered 2021-12-31 (×4): 5 mL via EPIDURAL

## 2021-12-31 MED ORDER — WITCH HAZEL-GLYCERIN EX PADS: 1.0000 "application " | MEDICATED_PAD | CUTANEOUS | Status: DC | PRN

## 2021-12-31 MED ORDER — LIDOCAINE-EPINEPHRINE (PF) 2 %-1:200000 IJ SOLN
INTRAMUSCULAR | Status: AC
  Filled 2021-12-31: qty 20

## 2021-12-31 MED ORDER — MISOPROSTOL 200 MCG PO TABS
ORAL_TABLET | ORAL | Status: AC
  Filled 2021-12-31: qty 1

## 2021-12-31 MED ORDER — KETOROLAC TROMETHAMINE 30 MG/ML IJ SOLN
INTRAMUSCULAR | Status: AC
  Filled 2021-12-31: qty 1

## 2021-12-31 MED ORDER — ONDANSETRON HCL 4 MG/2ML IJ SOLN
INTRAMUSCULAR | Status: DC | PRN
  Administered 2021-12-31: 4 mg via INTRAVENOUS

## 2021-12-31 MED ORDER — OXYCODONE HCL 5 MG PO TABS
ORAL_TABLET | ORAL | Status: AC
  Filled 2021-12-31: qty 1

## 2021-12-31 MED ORDER — COCONUT OIL OIL: 1.0000 "application " | TOPICAL_OIL | Status: DC | PRN

## 2021-12-31 MED ORDER — ONDANSETRON HCL 4 MG PO TABS: 4.0000 mg | ORAL_TABLET | ORAL | Status: DC | PRN

## 2021-12-31 MED ORDER — OXYTOCIN-SODIUM CHLORIDE 30-0.9 UT/500ML-% IV SOLN
1.0000 m[IU]/min | INTRAVENOUS | Status: DC
  Administered 2021-12-31: 2 m[IU]/min via INTRAVENOUS

## 2021-12-31 MED ORDER — ONDANSETRON HCL 4 MG/2ML IJ SOLN: 4.0000 mg | INTRAMUSCULAR | Status: DC | PRN

## 2021-12-31 MED ORDER — BENZOCAINE-MENTHOL 20-0.5 % EX AERO
1.0000 "application " | INHALATION_SPRAY | CUTANEOUS | Status: DC | PRN
  Filled 2021-12-31: qty 56

## 2021-12-31 MED ORDER — LACTATED RINGERS IV SOLN: INTRAVENOUS | Status: DC | PRN

## 2021-12-31 MED ORDER — MISOPROSTOL 200 MCG PO TABS
1000.0000 ug | ORAL_TABLET | Freq: Once | ORAL | Status: AC
  Administered 2021-12-31: 1000 ug via RECTAL

## 2021-12-31 MED ORDER — FENTANYL CITRATE (PF) 250 MCG/5ML IJ SOLN
INTRAMUSCULAR | Status: AC
  Filled 2021-12-31: qty 5

## 2021-12-31 MED ORDER — PRENATAL MULTIVITAMIN CH
1.0000 | ORAL_TABLET | Freq: Every day | ORAL | Status: DC
  Administered 2022-01-01: 1 via ORAL
  Filled 2021-12-31: qty 1

## 2021-12-31 MED ORDER — DIBUCAINE (PERIANAL) 1 % EX OINT: 1.0000 "application " | TOPICAL_OINTMENT | CUTANEOUS | Status: DC | PRN

## 2021-12-31 MED ORDER — ONDANSETRON HCL 4 MG/2ML IJ SOLN
INTRAMUSCULAR | Status: AC
  Filled 2021-12-31: qty 2

## 2021-12-31 MED ORDER — SIMETHICONE 80 MG PO CHEW: 80.0000 mg | CHEWABLE_TABLET | ORAL | Status: DC | PRN

## 2021-12-31 MED ORDER — IBUPROFEN 600 MG PO TABS
600.0000 mg | ORAL_TABLET | Freq: Four times a day (QID) | ORAL | Status: DC
  Administered 2021-12-31 – 2022-01-02 (×7): 600 mg via ORAL
  Filled 2021-12-31 (×7): qty 1

## 2021-12-31 MED ORDER — OXYCODONE HCL 5 MG PO TABS
5.0000 mg | ORAL_TABLET | ORAL | Status: DC | PRN
  Administered 2022-01-01 (×3): 5 mg via ORAL
  Filled 2021-12-31 (×3): qty 1

## 2021-12-31 MED ORDER — MEPERIDINE HCL 25 MG/ML IJ SOLN: 6.2500 mg | INTRAMUSCULAR | Status: DC | PRN

## 2021-12-31 MED ORDER — ZOLPIDEM TARTRATE 5 MG PO TABS: 5.0000 mg | ORAL_TABLET | Freq: Every evening | ORAL | Status: DC | PRN

## 2021-12-31 MED ORDER — SODIUM BICARBONATE 8.4 % IV SOLN
INTRAVENOUS | Status: DC | PRN
  Administered 2021-12-31: 8 mL via EPIDURAL

## 2021-12-31 MED ORDER — FENTANYL CITRATE (PF) 100 MCG/2ML IJ SOLN
INTRAMUSCULAR | Status: DC | PRN
  Administered 2021-12-31: 50 ug via INTRAVENOUS

## 2021-12-31 MED ORDER — ONDANSETRON HCL 4 MG/2ML IJ SOLN: 4.0000 mg | Freq: Once | INTRAMUSCULAR | Status: DC | PRN

## 2021-12-31 MED ORDER — SENNOSIDES-DOCUSATE SODIUM 8.6-50 MG PO TABS: 2.0000 | ORAL_TABLET | ORAL | Status: DC

## 2021-12-31 MED ORDER — OXYCODONE HCL 5 MG/5ML PO SOLN: 5.0000 mg | Freq: Once | ORAL | Status: AC | PRN

## 2021-12-31 MED ORDER — ACETAMINOPHEN 325 MG PO TABS: 325.0000 mg | ORAL_TABLET | ORAL | Status: DC | PRN

## 2021-12-31 MED ORDER — OXYTOCIN-SODIUM CHLORIDE 30-0.9 UT/500ML-% IV SOLN: 2.5000 [IU]/h | INTRAVENOUS | Status: DC | PRN

## 2021-12-31 SURGICAL SUPPLY — 22 items
BLADE SURG 11 STRL SS (BLADE) ×2 IMPLANT
CLIP FILSHIE TUBAL LIGA STRL (Clip) IMPLANT
DRSG OPSITE POSTOP 3X4 (GAUZE/BANDAGES/DRESSINGS) ×2 IMPLANT
DURAPREP 26ML APPLICATOR (WOUND CARE) ×2 IMPLANT
GLOVE BIOGEL PI IND STRL 7.0 (GLOVE) ×1 IMPLANT
GLOVE BIOGEL PI IND STRL 7.5 (GLOVE) ×1 IMPLANT
GLOVE BIOGEL PI INDICATOR 7.0 (GLOVE) ×1
GLOVE BIOGEL PI INDICATOR 7.5 (GLOVE) ×1
GLOVE ECLIPSE 7.5 STRL STRAW (GLOVE) ×2 IMPLANT
GOWN STRL REUS W/TWL LRG LVL3 (GOWN DISPOSABLE) ×4 IMPLANT
NEEDLE HYPO 22GX1.5 SAFETY (NEEDLE) ×2 IMPLANT
NS IRRIG 1000ML POUR BTL (IV SOLUTION) ×2 IMPLANT
PACK ABDOMINAL MINOR (CUSTOM PROCEDURE TRAY) ×2 IMPLANT
PROTECTOR NERVE ULNAR (MISCELLANEOUS) ×2 IMPLANT
SPONGE LAP 18X18 RF (DISPOSABLE) ×2 IMPLANT
SPONGE LAP 4X18 RFD (DISPOSABLE) IMPLANT
SUT PLAIN 0 NONE (SUTURE) IMPLANT
SUT VICRYL 0 UR6 27IN ABS (SUTURE) ×2 IMPLANT
SUT VICRYL 4-0 PS2 18IN ABS (SUTURE) ×2 IMPLANT
SYR CONTROL 10ML LL (SYRINGE) ×2 IMPLANT
TOWEL OR 17X24 6PK STRL BLUE (TOWEL DISPOSABLE) ×4 IMPLANT
TRAY FOLEY W/BAG SLVR 14FR (SET/KITS/TRAYS/PACK) ×2 IMPLANT

## 2021-12-31 NOTE — Progress Notes (Signed)
Patient desires permanent sterilization.  Other reversible forms of contraception were discussed with patient in detail; she declines all other modalities. Risks of procedure discussed with patient including but not limited to: risk of regret, permanence of method, bleeding, infection, injury to surrounding organs and need for additional procedures.  Failure risk of 1-2 % with increased risk of ectopic gestation if pregnancy occurs was also discussed with patient.  Patient verbalized understanding of these risks and wants to proceed with sterilization.  Written informed consent obtained.  To OR when ready.

## 2021-12-31 NOTE — Progress Notes (Signed)
S: Patient comfortable with epidural resting in bed with no concerns at this time.   O: Vitals:   12/31/21 0430 12/31/21 0501 12/31/21 0552 12/31/21 0601  BP: (!) 106/52 (!) 108/51 117/64 112/72  Pulse: 96 100 99 93  Resp:   16   Temp:   98.4 F (36.9 C)   TempSrc:   Oral   SpO2:      Weight:      Height:        FHT:  FHR: 140 bpm, variability: moderate,  accelerations:  Present,  decelerations:  Absent UC:   irregular ctxs SVE:   Dilation: 7.5 Effacement (%): 80 Station: -2 Exam by:: Mellody Dance, RN  A / P: 41 y.o. [id number] here for IOL due to A1GDM progressing normally s/p foley, Cytotec x3, and SROM  -A1GDM- CBG- 132 -labor augmentation- Pitocin  -GBS positive- Ancef Fetal Wellbeing:  Category I Pain Control:  Epidural Anticipated MOD:  NSVD  Iline Oven Korrey Schleicher,SNM 12/31/2021, 6:12 AM

## 2021-12-31 NOTE — Anesthesia Preprocedure Evaluation (Signed)
Anesthesia Evaluation  Patient identified by MRN, date of birth, ID band Patient awake    Reviewed: Allergy & Precautions, H&P , NPO status , Patient's Chart, lab work & pertinent test results, reviewed documented beta blocker date and time   Airway Mallampati: II  TM Distance: >3 FB Neck ROM: full    Dental no notable dental hx. (+) Teeth Intact, Dental Advisory Given   Pulmonary neg pulmonary ROS,    Pulmonary exam normal breath sounds clear to auscultation       Cardiovascular negative cardio ROS Normal cardiovascular exam Rhythm:regular Rate:Normal     Neuro/Psych negative neurological ROS  negative psych ROS   GI/Hepatic negative GI ROS, Neg liver ROS,   Endo/Other  negative endocrine ROSdiabetes  Renal/GU negative Renal ROS  negative genitourinary   Musculoskeletal negative musculoskeletal ROS (+)   Abdominal   Peds negative pediatric ROS (+)  Hematology negative hematology ROS (+)   Anesthesia Other Findings   Reproductive/Obstetrics negative OB ROS                             Anesthesia Physical Anesthesia Plan  ASA: 2  Anesthesia Plan: Epidural   Post-op Pain Management:    Induction:   PONV Risk Score and Plan:   Airway Management Planned:   Additional Equipment:   Intra-op Plan:   Post-operative Plan:   Informed Consent: I have reviewed the patients History and Physical, chart, labs and discussed the procedure including the risks, benefits and alternatives for the proposed anesthesia with the patient or authorized representative who has indicated his/her understanding and acceptance.     Dental Advisory Given  Plan Discussed with:   Anesthesia Plan Comments: (Labs checked- platelets confirmed with RN in room. Fetal heart tracing, per RN, reported to be stable enough for sitting procedure. Discussed epidural, and patient consents to the procedure:  included  risk of possible headache,backache, failed block, allergic reaction, and nerve injury. This patient was asked if she had any questions or concerns before the procedure started.)        Anesthesia Quick Evaluation

## 2021-12-31 NOTE — Op Note (Signed)
NAME@ 12/30/2021 - 12/31/2021  PREOPERATIVE DIAGNOSIS:  Undesired fertility  POSTOPERATIVE DIAGNOSIS:  Undesired fertility  PROCEDURE:  Postpartum Bilateral Tubal Sterilization using Pomeroy method   ANESTHESIA:  Epidural  ASSISTANTS: Dr. Leticia Penna  COMPLICATIONS:  None immediate.  ESTIMATED BLOOD LOSS:  Less than 20cc.  FLUIDS: 400 cc LR.  URINE OUTPUT:  150 cc of clear urine.  INDICATIONS: 41 y.o. yo [id number]  with undesired fertility,status post vaginal delivery, desires permanent sterilization. Risks and benefits of procedure discussed with patient including permanence of method, bleeding, infection, injury to surrounding organs and need for additional procedures. Risk failure of 0.5-1% with increased risk of ectopic gestation if pregnancy occurs was also discussed with patient.   FINDINGS:  Normal uterus, tubes, and ovaries.  TECHNIQUE: After informed consent was obtained, the patient was taken to the operating room where anesthesia was induced and found to be adequate. A Foley catheter was inserted to drain the bladder. A small, transverse, infraumbilical skin incision was made with the scalpel. This incision was carried down to the underlying layer of fascia. The fascia was grasped with Allis clamps, tented up, and entered sharply with Mayo scissors. Underlying peritoneum was then identified and entered bluntly. The patient's left fallopian tube was then identified, brought to the incision, and grasped with a Babcock clamp. The tube was then followed out to the fimbria. The Babcock clamp was then used to grasp the tube approximately 4 cm from the cornual region. A 3 cm segment of the tube was then ligated with two free ties of plain gut suture, transected, and excised. Hemostasis was noted and the tube was returned to the abdomen. The right fallopian tube was then identified to its fimbriated end, ligated with two free ties of plain gut suture, and a 3 cm segment excised in a  similar fashion. Hemostasis was noted, and the tube returned to the abdomen. The fascia was re-approximated with 0 Vicryl. The skin was closed in a subcuticular fashion with 3-0 Vicryl. The patient tolerated the procedure well. Sponge, lap, and needle count were correct x2. The patient was taken to recovery room in stable condition.

## 2021-12-31 NOTE — Discharge Summary (Signed)
Postpartum Discharge Summary     Patient Name: Crystal Boyd DOB: Jan 04, 1981 MRN: [id number]  Date of admission: 12/30/2021 Delivery date:12/31/2021  Delivering provider: Gavin Pound  Date of discharge: 01/02/2022  Admitting diagnosis: AMA (advanced maternal age) multigravida 21+, third trimester [O09.523] Gestational diabetes mellitus (GDM) [O24.419] Intrauterine pregnancy: [redacted]w[redacted]d    Secondary diagnosis:  Principal Problem:   SVD (spontaneous vaginal delivery) Active Problems:   Supervision of high risk pregnancy, antepartum   Unwanted fertility   GBS (group B Streptococcus carrier), +RV culture, currently pregnant   AMA (advanced maternal age) multigravida 35+, third trimester   Gestational diabetes mellitus (GDM)   S/P tubal ligation   Left foot drop  Additional problems: None     Discharge diagnosis: Term Pregnancy Delivered                                              Post partum procedures: None Induction: Pitocin, Cytotec, and IP Foley Complications: None  Hospital course: Induction of Labor With Vaginal Delivery   41y.o. yo [id number] admitted to the hospital 12/30/2021 for induction of labor.  Indication for induction: A1 DM and AMA.  Patient had an uncomplicated labor course as follows: Membrane Rupture Time/Date: 9:17 PM ,12/30/2021   Delivery Method:Vaginal, Spontaneous  Episiotomy: None  Lacerations:  2nd degree;Perineal  Details of delivery can be found in separate delivery note.  Patient had a postpartum course complicated by left foot drop and left lateral lower calf numbness. 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 Evaluated by anesthesia who also felt presentation was consistent with this. Improved over course of hospital stay. Evaluated by PT while inpatient. Plan to discharge with wheelchair and will plan for Neurology referral if symptoms do not continue to improve as expected over the next few weeks. Her  fasting CBG was normal at 82 postpartum. She was started on Lasix for intrapartum gHTN, and she will continue this upon discharge to complete a 5 day course. She is eating, drinking, and voiding without issue. Her pain and bleeding is controlled. Patient is discharged home 01/02/22.  Newborn Data: Birth date:12/31/2021  Birth time:1:06 PM  Gender:Female  Living status:Living  Apgars:7 ,8  Weight:3675 g   Magnesium Sulfate received: No BMZ received: No Rhophylac: N/A MMR: N/A T-DaP: Given prenatally Flu: No Transfusion: No  Physical exam  Vitals:   01/01/22 0500 01/01/22 1349 01/01/22 2100 01/02/22 0530  BP: 125/70 120/73 (!) 125/58 125/75  Pulse: 88 75 83 73  Resp:  _0 Temp: 98 F (36.7 C) (!) 97.5 F (36.4 C) 97.9 F (36.6 C) 98.1 F (36.7 C)  TempSrc: Oral Oral Oral Oral  SpO2: 99% 99% 99% 99%  Weight:      Height:       General: alert, cooperative, and no distress Lochia: appropriate Uterine Fundus: firm and below umbilicus  DVT Evaluation: trace LE edema to ankles bilaterally, improved dorsiflexion of left toes, still unable to fully dorsiflex left ankle, normal ROM at left knee and hip joint  Labs: Lab Results  Component Value Date   WBC 7.7 12/30/2021   HGB 9.9 (L) 12/30/2021   HCT 30.7 (L) 12/30/2021   MCV 79.5 (L) 12/30/2021   PLT 228 12/30/2021   CMP Latest Ref Rng & Units 12/30/2021  Glucose  99 mg/dL 99  °BUN 6 - 20 mg/dL 10  °Creatinine 0.44 - 1.00 mg/dL 0.64  °Sodium 135 - 145 mmol/L 136  °Potassium 3.5 - 5.1 mmol/L 3.8  °Chloride 98 - 111 mmol/L 104  °CO2 22 - 32 mmol/L 24  °Calcium 8.9 - 10.3 mg/dL 9.0  °Total Protein 6.5 - 8.1 g/dL 5.9(L)  °Total Bilirubin 0.3 - 1.2 mg/dL 0.7  °Alkaline Phos 38 - 126 U/L 110  °AST 15 - 41 U/L 19  °ALT 0 - 44 U/L 16  ° °Edinburgh Score: °Edinburgh Postnatal Depression Scale Screening Tool 01/01/2022  °I have been able to laugh and see the funny side of things. 0  °I have looked forward with enjoyment to things. 0   °I have blamed myself unnecessarily when things went wrong. 1  °I have been anxious or worried for no good reason. 1  °I have felt scared or panicky for no good reason. 1  °Things have been getting on top of me. 2  °I have been so unhappy that I have had difficulty sleeping. 1  °I have felt sad or miserable. 1  °I have been so unhappy that I have been crying. 0  °The thought of harming myself has occurred to me. 0  °Edinburgh Postnatal Depression Scale Total 7  ° ° ° °After visit meds:  °Allergies as of 01/02/2022   ° °   Reactions  ° Penicillins Shortness Of Breath  ° °  ° °  °Medication List  °  ° °STOP taking these medications   ° °Accu-Chek Guide w/Device Kit °  °Accu-Chek Softclix Lancets lancets °  °aspirin EC 81 MG tablet °  °glucose blood test strip °  ° °  ° °TAKE these medications   ° °acetaminophen 500 MG tablet °Commonly known as: TYLENOL °Take 2 tablets (1,000 mg total) by mouth every 8 (eight) hours as needed (pain). °  °Blood Pressure Kit Devi °1 Device by Does not apply route as needed. °  °furosemide 20 MG tablet °Commonly known as: LASIX °Take 1 tablet (20 mg total) by mouth daily for 3 days. °  °ibuprofen 600 MG tablet °Commonly known as: ADVIL °Take 1 tablet (600 mg total) by mouth every 6 (six) hours as needed (pain). °  °oxyCODONE 5 MG immediate release tablet °Commonly known as: Oxy IR/ROXICODONE °Take 1 tablet (5 mg total) by mouth every 6 (six) hours as needed for severe pain or breakthrough pain. °  °PRENATAL PO °Take by mouth. °  ° °  ° ° ° °Discharge home in stable condition °Infant Feeding: Bottle and Breast °Infant Disposition: home with mother °Discharge instruction: per After Visit Summary and Postpartum booklet. °Activity: Advance as tolerated. Pelvic rest for 6 weeks.  °Diet: routine diet °Future Appointments: °Future Appointments  °Date Time Provider Department Center  °02/11/2022  9:35 AM Walker, Jamilla R, CNM [facility]  ° °Follow up Visit: °Please schedule this patient for a  Virtual postpartum visit in 4 weeks with the following provider: Any provider. °Additional Postpartum F/U: Follow up in 2 weeks to reassess left foot drop   °High risk pregnancy complicated by:  AMA, A1GDM, gHTN °Delivery mode:  Vaginal, Spontaneous  °Anticipated Birth Control:  BTL done PP ° °01/02/2022 °Christina M Albert, MD ° ° ° °

## 2021-12-31 NOTE — Anesthesia Procedure Notes (Signed)
Epidural Patient location during procedure: OB Start time: 12/31/2021 12:08 AM End time: 12/31/2021 12:12 AM  Staffing Anesthesiologist: Leilani Able, MD Performed: anesthesiologist   Preanesthetic Checklist Completed: patient identified, IV checked, site marked, risks and benefits discussed, surgical consent, monitors and equipment checked, pre-op evaluation and timeout performed  Epidural Patient position: sitting Prep: DuraPrep and site prepped and draped Patient monitoring: continuous pulse ox and blood pressure Approach: midline Location: L3-L4 Injection technique: LOR air  Needle:  Needle type: Tuohy  Needle gauge: 17 G Needle length: 9 cm and 9 Needle insertion depth: 6 cm Catheter type: closed end flexible Catheter size: 19 Gauge Catheter at skin depth: 11 cm Test dose: negative and Other  Assessment Events: blood not aspirated, injection not painful, no injection resistance, paresthesia and negative IV test  Additional Notes L leg X 2Reason for block:procedure for pain

## 2021-12-31 NOTE — Anesthesia Postprocedure Evaluation (Signed)
Anesthesia Post Note  Patient: Crystal Boyd  Procedure(s) Performed: AN AD [place]     Patient location during evaluation: Mother Baby Anesthesia Type: Epidural Level of consciousness: awake and alert Pain management: pain level controlled Vital Signs Assessment: post-procedure vital signs reviewed and stable Respiratory status: spontaneous breathing, nonlabored ventilation and respiratory function stable Cardiovascular status: stable Postop Assessment: no headache, no backache and epidural receding Anesthetic complications: no   No notable events documented.  Last Vitals:  Vitals:   12/31/21 1405 12/31/21 1415  BP: 134/69 137/70  Pulse: 98 95  Resp: 16 16  Temp:    SpO2:      Last Pain:  Vitals:   12/31/21 1425  TempSrc:   PainSc: 0-No pain                 [name]

## 2021-12-31 NOTE — Lactation Note (Signed)
This note was copied from a baby's chart. Lactation Consultation Note  Patient Name: Boy Illana Nolting [id number] Date: 12/31/2021   Age:42 hours LC entered the room, mom was eating dinner at this time, LC will follow up with family later. Maternal Data    Feeding Nipple Type: Slow - flow  LATCH Score                    Lactation Tools Discussed/Used    Interventions    Discharge    Consult Status      Danelle Earthly 12/31/2021, 6:24 PM

## 2021-12-31 NOTE — Anesthesia Preprocedure Evaluation (Addendum)
Anesthesia Evaluation  Patient identified by MRN, date of birth, ID band Patient awake    Reviewed: Allergy & Precautions, H&P , Patient's Chart, lab work & pertinent test results  Airway Mallampati: I       Dental no notable dental hx.    Pulmonary neg pulmonary ROS,    Pulmonary exam normal        Cardiovascular negative cardio ROS Normal cardiovascular exam     Neuro/Psych negative neurological ROS  negative psych ROS   GI/Hepatic negative GI ROS, Neg liver ROS,   Endo/Other  diabetes, Gestational  Renal/GU negative Renal ROS  negative genitourinary   Musculoskeletal   Abdominal Normal abdominal exam  (+)   Peds  Hematology  (+) Blood dyscrasia, anemia ,   Anesthesia Other Findings   Reproductive/Obstetrics (+) Pregnancy                             Anesthesia Physical Anesthesia Plan  ASA: 2  Anesthesia Plan: Epidural   Post-op Pain Management:    Induction:   PONV Risk Score and Plan: 2  Airway Management Planned: Natural Airway  Additional Equipment: None  Intra-op Plan:   Post-operative Plan:   Informed Consent: I have reviewed the patients History and Physical, chart, labs and discussed the procedure including the risks, benefits and alternatives for the proposed anesthesia with the patient or authorized representative who has indicated his/her understanding and acceptance.       Plan Discussed with: Anesthesiologist and CRNA  Anesthesia Plan Comments:        Anesthesia Quick Evaluation

## 2021-12-31 NOTE — Anesthesia Postprocedure Evaluation (Signed)
Anesthesia Post Note  Patient: Crystal Boyd  Procedure(s) Performed: POST PARTUM TUBAL LIGATION     Patient location during evaluation: Mother Baby Anesthesia Type: Epidural Level of consciousness: awake and alert Pain management: pain level controlled Vital Signs Assessment: post-procedure vital signs reviewed and stable Respiratory status: spontaneous breathing, nonlabored ventilation and respiratory function stable Cardiovascular status: stable Postop Assessment: no headache, no backache and epidural receding Anesthetic complications: no   No notable events documented.  Last Vitals:  Vitals:   12/31/21 1640 12/31/21 1740  BP: 134/68 (!) 131/49  Pulse: 94 95  Resp: 16 16  Temp: 37.2 C 36.8 C  SpO2: 99% 99%    Last Pain:  Vitals:   12/31/21 1921  TempSrc:   PainSc: 3                  Arwin Bisceglia

## 2021-12-31 NOTE — Lactation Note (Signed)
This note was copied from a baby's chart. Lactation Consultation Note  Patient Name: Crystal Boyd [id number] Date: 12/31/2021 Reason for consult: L&D Initial assessment;Term Age:42 hours   Initial L&D Consult:  Attempted to visit with family, however, staff members working with mother.  She is going for a tubal after her repair.  Informed RN that lactation services will be available on the evening shift and a consultant will see her after her surgery.   Maternal Data    Feeding Mother's Current Feeding Choice: Breast Milk and Formula  LATCH Score                    Lactation Tools Discussed/Used    Interventions    Discharge    Consult Status Consult Status: Follow-up from L&D    Chon Buhl R Lamorris Knoblock 12/31/2021, 2:02 PM

## 2021-12-31 NOTE — Progress Notes (Signed)
S: Patient in bed reporting pain right lower abdomen otherwise doing well.  O: Vitals:   12/31/21 0630 12/31/21 0701 12/31/21 0730 12/31/21 0732  BP: (!) 112/58 (!) 106/58 134/66 134/66  Pulse: 92 84 96 96  Resp: 16  16 16   Temp:    98 F (36.7 C)  TempSrc:    Oral  SpO2:      Weight:      Height:        FHT:  FHR: 145 bpm, variability: moderate,  accelerations:  Present,  decelerations:  Present Early UC:   regular, every 1-3 minutes SVE:   Dilation: 7.5 Effacement (%): 80 Station: -2 Exam by:: [employer], SNM  A / P: 41 y.o. [id number] here for IOL due to A1GDM/AMA Protracted active phase, discussed IUPC for pitocin titration and patient agreed with the plan. IUPC placed without difficulty  -Will reassess cervix in a couple hours, adequate MVUs -A1GDM- CBG's q4 hrs  -GBS positive- Ancef Fetal Wellbeing:  Category I Pain Control:  Epidural Anticipated MOD:  NSVD  Trenton Gammon 12/31/2021, 7:47 AM

## 2021-12-31 NOTE — Transfer of Care (Signed)
Immediate Anesthesia Transfer of Care Note  Patient: Crystal Boyd  Procedure(s) Performed: POST PARTUM TUBAL LIGATION  Patient Location: PACU  Anesthesia Type:Epidural  Level of Consciousness: awake, alert  and oriented  Airway & Oxygen Therapy: Patient Spontanous Breathing  Post-op Assessment: Report given to RN and Post -op Vital signs reviewed and stable  Post vital signs: Reviewed and stable  Last Vitals:  Vitals Value Taken Time  BP 147/75 12/31/21 1525  Temp    Pulse 96 12/31/21 1526  Resp 15 12/31/21 1526  SpO2 98 % 12/31/21 1526  Vitals shown include unvalidated device data.  Last Pain:  Vitals:   12/31/21 1425  TempSrc:   PainSc: 0-No pain         Complications: No notable events documented.

## 2021-12-31 NOTE — Progress Notes (Signed)
Crystal Boyd is a 41 y.o. [id number] at [redacted]w[redacted]d by LMP admitted for induction of labor due to [place] and A1DM.  Subjective: Pt reports feeling contractions and lower abdomen pressure.  Left leg is numb, and feel everything on the right side.   Objective: Pt sitting high fowlers and breathing through contractions. Her mom Crystal Boyd) is at the bedside. BP (!) 120/53 (BP Location: Left Arm)    Pulse (!) 105    Temp 99 F (37.2 C) (Oral)    Resp 18    Ht 5\' 4"  (1.626 m)    Wt 79.6 kg    LMP 04/01/2021    SpO2 97%    BMI 30.12 kg/m  I/O last 3 completed shifts: In: -  Out: 600 [Urine:600] Total I/O In: -  Out: 450 [Urine:450]  FHT:  FHR: 160's bpm, variability: minimal ,  accelerations:  Abscent,  decelerations:  Present early and variable UC:   regular, every 2-3 minutes, 60-70 seconds, moderate SVE:   Dilation: 7.5 Effacement (%): 90 Station: -2 Exam by:: [email or web address] midwife student  Labs: Lab Results  Component Value Date   WBC 7.7 12/30/2021   HGB 9.9 (L) 12/30/2021   HCT 30.7 (L) 12/30/2021   MCV 79.5 (L) 12/30/2021   PLT 228 12/30/2021    Assessment / Plan: IOL d/t A1GDM, AMA, LGA.   Labor:  Protracted Active Phase: IUPC in place. Pitocin on 16. MVU's adequate (300). Fetal presentation asynclitic.  RN to call anesthesia for epidural assessment. Once, patient comfort able- try position changes and reevaluate dilation, descent and presentation. MD consult to discuss R/B continuation of current plan vs. C/S.  Fetal Wellbeing:  Category II, improved with maternal position changes.  Continue to assess.  Pain Control:  Epidural I/D:   GBS positive: continue PCN per protocol Anticipated MOD:  NSVD  Crystal Boyd 12/31/2021, 11:08 AM

## 2021-12-31 NOTE — Lactation Note (Signed)
This note was copied from a baby's chart. Lactation Consultation Note  Patient Name: Crystal Boyd [id number] Date: 12/31/2021 Reason for consult: Initial assessment;Term;1st time breastfeeding (Per mom, she did not BF her 1st child. Mom's feeding plan is breastfeeding and supplementing with formula.) Age:42 hours P2, term female infant. Mom's feeding choice is breast and  supplementing infant with formula. Mom attempted to latch infant at the breast infant was sleepy and would not latch at this time.  Infant was given 10 mls of formula after mom attempted with latching infant at the breast, this is mom's feeding choice. LC observed mom has areola edema, mom was given breast shells to help alleviate edema. LC used breast model to teach hand expression and mom self expressed but colostrum wasn't present at this time. Mom was given hand pump to pre-pump breast prior to latching infant to help evert nipple shaft out more, see latch score below regarding mom's breast tissue. Mom shown how to use hand pump & how to disassemble, clean, & reassemble parts.  Mom made aware of O/P services, breastfeeding support groups, community resources, and our phone # for post-discharge questions.   Mom's plan: 1- Mom will continue to work towards latching infant at the breast and continue to ask RN/LC for latch assistance. 2- Mom will breastfeed infant according to hunger cues, 8 to 12+ or more times within 24 hours, skin to skin. 3- Mom will pre-pump breast prior to latching infant and wear breast shells in bra when not sleeping. 4- Mom's plan if infant doesn't latch she will continue supplementing infant with any EBM and then formula ( this is her choice). Maternal Data Has patient been taught Hand Expression?: Yes Does the patient have breastfeeding experience prior to this delivery?: No  Feeding Mother's Current Feeding Choice: Breast Milk and Formula  LATCH Score Latch: Too sleepy or reluctant, no latch  achieved, no sucking elicited.  Audible Swallowing: None  Type of Nipple: Flat  Comfort (Breast/Nipple): Soft / non-tender  Hold (Positioning): Assistance needed to correctly position infant at breast and maintain latch.  LATCH Score: 4   Lactation Tools Discussed/Used Tools: Shells;Pump Breast pump type: Manual Pump Education: Setup, frequency, and cleaning;Milk Storage Reason for Pumping: Pre-pump prior to latching infant at the breast due to areola edema and nipples being flat. Pumping frequency: pre-pump prior to latching infant at the breast.  Interventions Interventions: Breast feeding basics reviewed;Assisted with latch;Skin to skin;Hand express;Reverse pressure;Breast compression;Pre-pump if needed;Adjust position;Support pillows;Position options;Hand pump;Pace feeding;Education;LC Services brochure  Discharge    Consult Status Consult Status: Follow-up Date: 01/01/22 Follow-up type: In-patient    Vicente Serene 12/31/2021, 8:11 PM

## 2022-01-01 LAB — GLUCOSE, CAPILLARY: Glucose-Capillary: 82 mg/dL (ref 70–99)

## 2022-01-01 MED ORDER — FUROSEMIDE 20 MG PO TABS
20.0000 mg | ORAL_TABLET | Freq: Every day | ORAL | Status: DC
  Administered 2022-01-01 – 2022-01-02 (×2): 20 mg via ORAL
  Filled 2022-01-01 (×2): qty 1

## 2022-01-01 NOTE — Progress Notes (Signed)
Evaluated for L foot drop. Witnessed patient walking from bathroom to bed and able to minimally dorsiflex L foot but able to ambulate unassisted; also describes numbness in classic peroneal nerve distribution (lateral aspect of leg below the knee and dorsal aspect of foot). I explained to the patient that the most common cause of this nerve injury is from stirrups/lithotomy position. I also explained it may take a few weeks to completely resolve, but most patients experience recovery of nerve function sooner than that. She has post-partum appt 2 weeks from discharge- I suggest consulting outpatient neurology at that point if the foot drop is still severe. Imaging at this point is very unlikely to reveal anything as I suspect this is not from the epidural, but rather from peripheral nerve injury.

## 2022-01-01 NOTE — Progress Notes (Addendum)
Post Partum Day 1 Subjective: Doing well. No acute events overnight. Pain is controlled and bleeding is appropriate. She reports that her abdomen in general is sore from delivery and her surgery postpartum. She is eating, drinking, and voiding without issue. She reports that she has residual left leg numbness that has made it hard for her to ambulate well. She has a hard time lifting up her left foot. She is currently needing assistance from a family member or RN to get around. She is formula and breast feeding which is going well. She has no other concerns at this time.  Objective: Blood pressure 125/70, pulse 88, temperature 98 F (36.7 C), temperature source Oral, resp. rate 16, height 5\' 4"  (1.626 m), weight 79.6 kg, last menstrual period 04/01/2021, SpO2 99 %, unknown if currently breastfeeding.  Physical Exam:  General: alert, cooperative, and no distress Lochia: appropriate Uterine Fundus: firm and at umbilicus  Incision: healing well, no significant drainage, dressing is clean, dry, and intact  DVT Evaluation: trace nonpitting edema to ankles bilaterally, normal strength and sensation in RLE, normal sensation in LLE, inability to dorsiflex left foot, plantar flexion intact, normal ROM at left knee and hip joint   Recent Labs    12/30/21 0815  HGB 9.9*  HCT 30.7*    Assessment/Plan: Crystal Boyd is a 41 y.o. 46 on PPD# 1 s/p SVD and POD#1 s/p BTL.  Progressing well. Meeting postpartum milestones. VSS. Continue routine postpartum care.  #A1GDM: - Fasting CBG 82 this morning  - Plan for GTT at 6 week visit   #Elevated BP: - BP noted to be elevated intrapartum - Continues to improve, now within normal range - No signs/symptoms of pre-eclampsia - Will start PO Lasix today - Will continue to monitor closely, BP check in one week   #LLE weakness: - Primarily left foot, inability to dorsiflex  - No back pain or leg pain, symptoms unilateral - Unlikely related to  epidural, suspect related to labor process and sacral plexus injury - PT ordered to help with mobility and ADLs - Will continue to monitor closely - Consider advanced imaging if symptoms acutely worsen or do not improve   Feeding: Breast and formula  Contraception: BTL Circumcision: Desires, consented at bedside this AM  Dispo: Plan for discharge on PPD#2.    LOS: 2 days   [id number], MD  01/01/2022, 5:48 AM

## 2022-01-01 NOTE — Evaluation (Signed)
Physical Therapy Evaluation Patient Details Name: Crystal Boyd MRN: [id number] DOB: 01-16-1981 Today's Date: 01/01/2022  History of Present Illness  Pt s/p vaginal delivery 12/31/21. Post delivery pt reported lt leg numbness and lt foot drop. 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 PMH - gestational diabetes.  Clinical Impression  Pt presents to PT with decr mobility due to lt foot drop after childbirth. Pt without active dorsiflexion in lt foot causing her to have to compensate by using steppage (picking leg up higher) gait. Gave pt Crystal toe lifter that will help bring up that foot into dorsiflexion while amb improving stability. The toe lifter works better with closed toe shoes but pt only had sandals so she tried it while barefoot. It did improve stability as did using Crystal walker. Recommend walker for home at dc and instructed pt to use toe lifter with shoes. As nerve injury improves these issues should resolve. Recommended to pt that if she didn't haven't improvement in foot drop within the next 2 weeks that she should let her physician know. Will return tomorrow to see pt again prior to dc.       Recommendations for follow up therapy are one component of Crystal multi-disciplinary discharge planning process, led by the attending physician.  Recommendations may be updated based on patient status, additional functional criteria and insurance authorization.  Follow Up Recommendations No PT follow up    Assistance Recommended at Discharge Intermittent Supervision/Assistance  Patient can return home with the following  Help with stairs or ramp for entrance;Other (comment) (assist with baby)    Equipment Recommendations Rolling walker (2 wheels)  Recommendations for Other Services       Functional Status Assessment Patient has had Crystal recent decline in their functional status and demonstrates the ability to make significant improvements in function in Crystal reasonable and predictable amount of time.     Precautions /  Restrictions Precautions Precautions: Fall      Mobility  Bed Mobility               General bed mobility comments: Pt up on EOB    Transfers Overall transfer level: Modified independent Equipment used: None                    Ambulation/Gait Ambulation/Gait assistance: Min guard, Supervision Gait Distance (Feet): 50 Feet Assistive device: None, Rolling walker (2 wheels) Gait Pattern/deviations: Step-through pattern, Decreased dorsiflexion - left, Steppage Gait velocity: decr Gait velocity interpretation: <1.31 ft/sec, indicative of household ambulator   General Gait Details: Pt without any active dorsiflexion on lt causing her to use steppage gait to clear lt foot. Without assistive device min guard due to decr stability. Much improved stability using walker and "toe lifter" to compensate  Stairs            Wheelchair Mobility    Modified Rankin (Stroke Patients Only)       Balance Overall balance assessment: Mild deficits observed, not formally tested                                           Pertinent Vitals/Pain Pain Assessment Pain Assessment: No/denies pain    Home Living Family/patient expects to be discharged to:: Private residence Living Arrangements: Spouse/significant other;Children Available Help at Discharge: Family;Available 24 hours/day Type of Home: House       Alternate Level Stairs-Number of Steps: flight Home Layout:  Two level;Bed/bath upstairs Home Equipment: None      Prior Function Prior Level of Function : Independent/Modified Independent                     Hand Dominance        Extremity/Trunk Assessment   Upper Extremity Assessment Upper Extremity Assessment: Overall WFL for tasks assessed    Lower Extremity Assessment Lower Extremity Assessment: LLE deficits/detail LLE Deficits / Details: 0/5 dorsiflexion LLE Sensation: decreased light touch (numbness from knee down  laterally and dorsally on foot)       Communication   Communication: No difficulties  Cognition Arousal/Alertness: Awake/alert Behavior During Therapy: WFL for tasks assessed/performed Overall Cognitive Status: Within Functional Limits for tasks assessed                                          General Comments      Exercises     Assessment/Plan    PT Assessment Patient needs continued PT services  PT Problem List Decreased mobility;Decreased strength;Impaired sensation       PT Treatment Interventions DME instruction;Gait training;Stair training;Functional mobility training;Therapeutic activities;Therapeutic exercise;Patient/family education    PT Goals (Current goals can be found in the Care Plan section)  Acute Rehab PT Goals Patient Stated Goal: have foot return to normal PT Goal Formulation: With patient Time For Goal Achievement: 01/06/22 Potential to Achieve Goals: Good Additional Goals Additional Goal #1: Pt will demonstrate understanding of how to don/doff toe lifter    Frequency Min 3X/week     Co-evaluation               AM-PAC PT "6 Clicks" Mobility  Outcome Measure Help needed turning from your back to your side while in Crystal flat bed without using bedrails?: None Help needed moving from lying on your back to sitting on the side of Crystal flat bed without using bedrails?: None Help needed moving to and from Crystal bed to Crystal chair (including Crystal wheelchair)?: None Help needed standing up from Crystal chair using your arms (e.g., wheelchair or bedside chair)?: None Help needed to walk in hospital room?: Crystal Little Help needed climbing 3-5 steps with Crystal railing? : Crystal Little 6 Click Score: 22    End of Session   Activity Tolerance: Patient tolerated treatment well Patient left: in bed;with call bell/phone within reach;with family/visitor present (sitting EOB) Nurse Communication: Mobility status PT Visit Diagnosis: Other abnormalities of gait and  mobility (R26.89)    Time: 1345-1415 PT Time Calculation (min) (ACUTE ONLY): 30 min   Charges:   PT Evaluation $PT Eval Low Complexity: 1 Low PT Treatments $Gait Training: 8-22 mins        Crystal Boyd PT Acute Rehabilitation Services Pager [phone] Office [phone]   Angelina Ok [facility] 01/01/2022, 2:35 PM

## 2022-01-01 NOTE — Addendum Note (Signed)
Addendum  created 01/01/22 0944 by Kearra Calkin, Waynard Reeds, CRNA   Flowsheet accepted

## 2022-01-01 NOTE — Lactation Note (Signed)
This note was copied from a baby's chart. Lactation Consultation Note  Patient Name: Crystal Boyd [id number] Date: 01/01/2022 Reason for consult: Follow-up assessment;Term;1st time breastfeeding Age:42 hours   P2 mother whose infant is now 36 hours old.  This is a term baby at 39+1 weeks.  Mother did not breast feed her first child.  Mother's current feeding preference is breast/formula.  Spoke with RN prior to visiting family.  Baby has been too sleepy to awaken and breast feed or bottle feed at this time.  Reviewed breast feeding basics and asked permission to have a lactation consultant follow up later today to assist with latching if needed.  Mother approved and would be happy to have a visit later today.  Mother has been supplementing with appropriate volume amounts.  She has a DEBP for home use.  Will have evening LC follow up.   Maternal Data Has patient been taught Hand Expression?: Yes Does the patient have breastfeeding experience prior to this delivery?: No  Feeding Mother's Current Feeding Choice: Breast Milk and Formula Nipple Type: Slow - flow  LATCH Score                    Lactation Tools Discussed/Used Tools: Shells;Pump;Flanges Flange Size: 24 Breast pump type: Manual Pumping frequency: PRN  Interventions Interventions: Breast feeding basics reviewed;Education  Discharge Pump: Personal  Consult Status Consult Status: Follow-up Date: 01/02/22 Follow-up type: In-patient    [name] 01/01/2022, 2:49 PM

## 2022-01-02 DIAGNOSIS — M21372 Foot drop, left foot: Secondary | ICD-10-CM | POA: Diagnosis not present

## 2022-01-02 MED ORDER — ACETAMINOPHEN 500 MG PO TABS
1000.0000 mg | ORAL_TABLET | Freq: Three times a day (TID) | ORAL | 0 refills | Status: AC | PRN
Start: 2022-01-02 — End: ?

## 2022-01-02 MED ORDER — FUROSEMIDE 20 MG PO TABS: 20.0000 mg | ORAL_TABLET | Freq: Every day | ORAL | 0 refills | Status: AC

## 2022-01-02 MED ORDER — IBUPROFEN 600 MG PO TABS
600.0000 mg | ORAL_TABLET | Freq: Four times a day (QID) | ORAL | 0 refills | Status: AC | PRN
Start: 2022-01-02 — End: ?

## 2022-01-02 MED ORDER — OXYCODONE HCL 5 MG PO TABS: 5.0000 mg | ORAL_TABLET | Freq: Four times a day (QID) | ORAL | 0 refills | Status: AC | PRN

## 2022-01-02 NOTE — Progress Notes (Signed)
TOC Case Manager notified of need for RW for home use, per PT recommendation; referral to [employer] [facility] for RW, to be delivered to bedside prior to dc.    No other dc needs identified.   Quintella Baton, RN, BSN  Trauma/Neuro ICU Case Manager [phone]

## 2022-01-02 NOTE — Social Work (Signed)
TOC Case Manager made aware of  MOB DME-RW need.  ° °Shawnelle Spoerl, MSW, LCSW °[facility]  °Clinical Social Worker  °[phone] °01/02/2022  11:22 AM  °

## 2022-01-02 NOTE — Plan of Care (Signed)
Discharge instructions given with after visit summary, pt receptive.  

## 2022-01-02 NOTE — Progress Notes (Signed)
Physical Therapy Treatment Patient Details Name: Crystal Boyd MRN: [id number] DOB: 1981/01/30 Today's Date: 01/02/2022   History of Present Illness Pt s/p vaginal delivery 12/31/21. Post delivery pt reported lt leg numbness and lt foot drop. 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 PMH - gestational diabetes.    PT Comments    Pt making gradual improvement.  She reports able to put more pressure on L foot but still has foot drop.  Pt does well compensating with RW and steppage gait.  Educated on safety, HEP and follow up with MD 2 weeks if not improving.     Recommendations for follow up therapy are one component of a multi-disciplinary discharge planning process, led by the attending physician.  Recommendations may be updated based on patient status, additional functional criteria and insurance authorization.  Follow Up Recommendations  No PT follow up (unless not improving at 2 weeks)     Assistance Recommended at Discharge Intermittent Supervision/Assistance  Patient can return home with the following Help with stairs or ramp for entrance (assist with baby)   Equipment Recommendations  Rolling walker (2 wheels)    Recommendations for Other Services       Precautions / Restrictions Precautions Precautions: Fall     Mobility  Bed Mobility Overal bed mobility: Independent                  Transfers Overall transfer level: Independent Equipment used: None                    Ambulation/Gait Ambulation/Gait assistance: Supervision Gait Distance (Feet): 100 Feet Assistive device: None, Rolling walker (2 wheels) Gait Pattern/deviations: Step-to pattern, Steppage Gait velocity: decr     General Gait Details: Pt ambulating some in room without AD, reports she can put more weight on foot today.  Still with L foot drop but compensates well with RW and steppage gait.  Feet are too edematous for shoes to use toe lifter.   Stairs Stairs:  (Pt has rails with steps,  discussed having supervision and "up with good down with bad " technique)           Wheelchair Mobility    Modified Rankin (Stroke Patients Only)       Balance Overall balance assessment: Needs assistance   Sitting balance-Leahy Scale: Normal     Standing balance support: No upper extremity supported, Bilateral upper extremity supported Standing balance-Leahy Scale: Good Standing balance comment: could static stand and take a few steps without RW                            Cognition Arousal/Alertness: Awake/alert Behavior During Therapy: WFL for tasks assessed/performed Overall Cognitive Status: Within Functional Limits for tasks assessed                                          Exercises      General Comments General comments (skin integrity, edema, etc.): Pt educated on HEP including gentle PF/heel cord stretch on L, AAROM L ankle, and begining AROM (ABCs) as ankle strength returns.  Pt asked about massage - educated distal to proximal to not push more edema into foot. Also encouraged to keep feet elevated and if not improving in 2 weeks to f/u with MD.      Pertinent Vitals/Pain Pain Assessment Pain Assessment: No/denies pain  Home Living                          Prior Function            PT Goals (current goals can now be found in the care plan section) Progress towards PT goals: Progressing toward goals    Frequency    Min 3X/week      PT Plan Current plan remains appropriate    Co-evaluation              AM-PAC PT "6 Clicks" Mobility   Outcome Measure  Help needed turning from your back to your side while in a flat bed without using bedrails?: None Help needed moving from lying on your back to sitting on the side of a flat bed without using bedrails?: None Help needed moving to and from a bed to a chair (including a wheelchair)?: None Help needed standing up from a chair using your arms (e.g.,  wheelchair or bedside chair)?: None Help needed to walk in hospital room?: A Little Help needed climbing 3-5 steps with a railing? : A Little 6 Click Score: 22    End of Session   Activity Tolerance: Patient tolerated treatment well Patient left: in bed;with call bell/phone within reach;with family/visitor present Nurse Communication: Mobility status;Other (comment) (RN calling case manager about RW) PT Visit Diagnosis: Other abnormalities of gait and mobility (R26.89)     Time: 3086-5784 PT Time Calculation (min) (ACUTE ONLY): 18 min  Charges:  $Gait Training: 8-22 mins                     Anise Salvo, PT Acute Rehab Services Pager [phone] [facility] [phone]    Rayetta Humphrey 01/02/2022, 11:37 AM

## 2022-01-05 ENCOUNTER — Encounter: Payer: Self-pay | Admitting: Family Medicine

## 2022-01-05 ENCOUNTER — Telehealth: Payer: Self-pay

## 2022-01-05 LAB — SURGICAL PATHOLOGY

## 2022-01-05 NOTE — Telephone Encounter (Signed)
Transition Care Management Follow-up Telephone Call Date of discharge and from where: 01/02/2022 from [facility] How have you been since you were released from the hospital? Patient stated that she is feeling okay. Patient stated that she is still experiencing some left leg swelling and states that it is difficult to walk on.  Any questions or concerns? No  Items Reviewed: Did the pt receive and understand the discharge instructions provided? Yes  Medications obtained and verified? Yes  Other? No  Any new allergies since your discharge? No  Dietary orders reviewed? No Do you have support at home? Yes   Functional Questionnaire: (I = Independent and D = Dependent) ADLs: I  Bathing/Dressing- I  Meal Prep- I  Eating- I  Maintaining continence- I  Transferring/Ambulation- I  Managing Meds- I   Follow up appointments reviewed:  PCP Hospital f/u appt confirmed? No   Specialist Hospital f/u appt confirmed? No  Patient is reaching to [facility] for Left leg swelling.  Are transportation arrangements needed? No  If their condition worsens, is the pt aware to call PCP or go to the Emergency Dept.? Yes Was the patient provided with contact information for the PCP's office or ED? Yes Was to pt encouraged to call back with questions or concerns? Yes

## 2022-01-10 ENCOUNTER — Telehealth (HOSPITAL_COMMUNITY): Payer: Self-pay | Admitting: *Deleted

## 2022-01-10 DIAGNOSIS — Z1331 Encounter for screening for depression: Secondary | ICD-10-CM

## 2022-01-10 NOTE — Telephone Encounter (Signed)
Patient reported that she continues to have numbness and foot drop in her left leg. Has appointment with provider for follow-up scheduled for 3/9. Voiced no other questions or concerns at this time. EPDS=14. RN generated referral for [facility] - sent to on-call provider, Dr. Damita Dunnings. Patient reports infant sounds "gurgly congested" after feedings. Has tried to clear secretions with bulb syringe without success. Infant was seen by pediatrician on 2/28, per patients report. RN suggested patient try a humidifier in infant's room. Instructed patient to call pediatrician if she continues to have concerns. Patient voiced no other questions or concerns regarding infant at this time. Patient reports infant sleeps in a crib on his back. RN reviewed ABCs of safe sleep. Patient verbalized understanding. Patient requested RN email information on hospital's virtual postpartum classes and support groups and maternal mental health resources. Email sent. Erline Levine, RN, 01/10/22, 1125  ?

## 2022-01-13 ENCOUNTER — Encounter: Payer: Self-pay | Admitting: Family Medicine

## 2022-01-15 ENCOUNTER — Ambulatory Visit (INDEPENDENT_AMBULATORY_CARE_PROVIDER_SITE_OTHER): Payer: Medicaid Other | Admitting: Family Medicine

## 2022-01-15 ENCOUNTER — Other Ambulatory Visit: Payer: Self-pay

## 2022-01-15 VITALS — BP 114/76 | Wt 145.9 lb

## 2022-01-15 DIAGNOSIS — M21372 Foot drop, left foot: Secondary | ICD-10-CM

## 2022-01-15 NOTE — Progress Notes (Signed)
? ?  Subjective:  ? ? Patient ID: Crystal Boyd is a 41 y.o. female presenting with Foot Drop follow up ? on 01/15/2022 ? ?HPI: ?Here today for f/u. Patient had an SVD with epidural and had an immediate foot drop. She was seen by PT and anesthesia see her and went home with a wheel chair. She reports her right leg numbness feels improved but her foot drop remains. It is impacting her ability to care for her baby. She does have a brace she ordered which is helping her manage some but requires shoes to be on. ? ?Review of Systems  ?Constitutional:  Negative for chills and fever.  ?Respiratory:  Negative for shortness of breath.   ?Cardiovascular:  Negative for chest pain.  ?Gastrointestinal:  Negative for abdominal pain, nausea and vomiting.  ?Genitourinary:  Negative for dysuria.  ?Skin:  Negative for rash.  ?   ?Objective:  ?  ?BP 114/76   Wt 145 lb 14.4 oz (66.2 kg)   LMP 04/01/2021   Breastfeeding No   BMI 25.04 kg/m?  ?Physical Exam ?Exam conducted with a chaperone present.  ?Constitutional:   ?   General: She is not in acute distress. ?   Appearance: She is well-developed.  ?HENT:  ?   Head: Normocephalic and atraumatic.  ?Eyes:  ?   General: No scleral icterus. ?Cardiovascular:  ?   Rate and Rhythm: Normal rate.  ?Pulmonary:  ?   Effort: Pulmonary effort is normal.  ?Abdominal:  ?   Palpations: Abdomen is soft.  ?Musculoskeletal:  ?   Cervical back: Neck supple.  ?Skin: ?   General: Skin is warm and dry.  ?Neurological:  ?   Mental Status: She is alert and oriented to person, place, and time.  ? ? ? ?   ?Assessment & Plan:  ? ?Problem List Items Addressed This Visit   ? ?  ? Unprioritized  ? Left foot drop - Primary  ?  Patient is not improving, has not had formal imaging. All happened after epidural. Her 2nd stage was 4 minutes and she was not in lithotomy. Will get MRI, after speaking to rads and refer to neuro. ?  ?  ? Relevant Orders  ? Ambulatory referral to Neurology  ? MR Lumbar Spine W Wo Contrast   ? ? ? ?Return in about 4 weeks (around 02/12/2022) for needs 2 hour, referral to neuro. ? ?Donnamae Jude, MD ?01/15/2022 ?4:53 PM ? ? ? ?

## 2022-01-15 NOTE — Assessment & Plan Note (Signed)
Patient is not improving, has not had formal imaging. All happened after epidural. Her 2nd stage was 4 minutes and she was not in lithotomy. Will get MRI, after speaking to rads and refer to neuro. ?

## 2022-01-15 NOTE — Progress Notes (Signed)
MRI scheduled for 3/17 at 3pm, arrival 230pm at [facility]  ?Pt called with appt. Sent staff message to Oleh Genin for PA.  ?Judeth Cornfield, RN  ?

## 2022-01-16 ENCOUNTER — Ambulatory Visit: Payer: Medicaid Other | Admitting: Family Medicine

## 2022-01-23 ENCOUNTER — Other Ambulatory Visit: Payer: Self-pay

## 2022-01-23 ENCOUNTER — Ambulatory Visit (HOSPITAL_COMMUNITY)
Admission: RE | Admit: 2022-01-23 | Discharge: 2022-01-23 | Disposition: A | Discharge status: Home / Self Care | Payer: Medicaid Other | Source: Ambulatory Visit | Attending: Family Medicine | Admitting: Family Medicine

## 2022-01-23 DIAGNOSIS — M48061 Spinal stenosis, lumbar region without neurogenic claudication: Secondary | ICD-10-CM | POA: Diagnosis not present

## 2022-01-23 DIAGNOSIS — M21372 Foot drop, left foot: Secondary | ICD-10-CM | POA: Diagnosis present

## 2022-01-23 DIAGNOSIS — M5416 Radiculopathy, lumbar region: Secondary | ICD-10-CM | POA: Diagnosis not present

## 2022-01-23 MED ORDER — GADOBUTROL 1 MMOL/ML IV SOLN
6.5000 mL | Freq: Once | INTRAVENOUS | Status: AC | PRN
  Administered 2022-01-23: 6.5 mL via INTRAVENOUS

## 2022-01-26 ENCOUNTER — Telehealth: Payer: Self-pay

## 2022-01-26 NOTE — Telephone Encounter (Signed)
Pt left VM on nurse line stating she is returning call from [place]. Returned call to patient to reschedule 2 HR GTT. Pt would like to move to the morning. States next Monday, 02/02/22 she is available; scheduled for 9:10 AM. Reviewed with patient that she should be fasting for 8 hours prior. ?

## 2022-01-27 ENCOUNTER — Ambulatory Visit: Payer: Medicaid Other | Admitting: Neurology

## 2022-01-27 ENCOUNTER — Encounter: Payer: Self-pay | Admitting: Neurology

## 2022-01-27 VITALS — BP 110/75 | HR 70 | Ht 64.0 in | Wt 145.0 lb

## 2022-01-27 DIAGNOSIS — M21372 Foot drop, left foot: Secondary | ICD-10-CM | POA: Diagnosis not present

## 2022-01-27 NOTE — Progress Notes (Signed)
?[facility] ? ? ? ?Provider:  Dr Jaynee Eagles ?Requesting Provider: Donnamae Jude, MD ?Primary Care Provider:  Pcp, No ? ?CC:  Foot drop after birth ? ?HPI:  Crystal Boyd is a 41 y.o. female here as requested by Donnamae Jude, MD for foot drop after birth: ? ?She was giving birth, her epidural was inserted and her left leg jerked, her right side was not numb, left side completely numb, she was induced, she was 7.5cm dilated and she was in labor 30 hours, she never pushed, she was laying on the left side, but would switch sides every 2 hours. She had the epidural on Tuesday at 5cm then went to 7.5cm then she stayed there until Wednesday and 1pm had the baby without pushing, epudural in the whole time. She gave birth and in the room when she got up to use the bathropm she could not feel the foot. Started below the knee and lateral to the top of th foot and complete foot drop. No PT. She has a brace/orthosis on line because she kept tripping. She is practicing exercises at home, only wears the brace when has to walk. Numbness is getting better, but weakness is not. Now she is feeling weak in the proximal thigh (point to the inguinal ligament), no sensory changes above the knee and some mild back pain no shooting pain into the leg. ? ?Reviewed notes, labs and imaging from outside physicians, which showed: ? ?Personally reviewed images and agree: IMPRESSION: ?1. No epidural fluid collection or other abnormality within the ?epidural space. ?2. Mild canal narrowing throughout the lumbar spine on the basis of ?congenitally short pedicles. Mild facet hypertrophy bilaterally at ?the L2-3 through L5-S1 levels without significant foraminal ?stenosis. ? ?12/30/2021 cmp unremarkable ? ?Review of Systems: ?Patient complains of symptoms per HPI as well as the following symptoms foot weakness. Pertinent negatives and positives per HPI. All others negative. ? ? ?Social History  ? ?Socioeconomic History  ? Marital  status: Married  ?  Spouse name: Not on file  ? Number of children: Not on file  ? Years of education: Not on file  ? Highest education level: Not on file  ?Occupational History  ? Not on file  ?Tobacco Use  ? Smoking status: Never  ? Smokeless tobacco: Never  ?Vaping Use  ? Vaping Use: Never used  ?Substance and Sexual Activity  ? Alcohol use: No  ?  Alcohol/week: 0.0 standard drinks  ? Drug use: No  ? Sexual activity: Yes  ?  Birth control/protection: None  ?Other Topics Concern  ? Not on file  ?Social History Narrative  ? Not on file  ? ?Social Determinants of Health  ? ?Financial Resource Strain: Not on file  ?Food Insecurity: No Food Insecurity  ? Worried About Charity fundraiser in the Last Year: Never true  ? Ran Out of Food in the Last Year: Never true  ?Transportation Needs: No Transportation Needs  ? Lack of Transportation (Medical): No  ? Lack of Transportation (Non-Medical): No  ?Physical Activity: Not on file  ?Stress: Not on file  ?Social Connections: Not on file  ?Intimate Partner Violence: Not on file  ? ? ?Family History  ?Problem Relation Age of Onset  ? Kidney disease Mother   ? Hypertension Mother   ? Stroke Mother   ? ? ?Past Medical History:  ?Diagnosis Date  ? Anemia   ? Gestational diabetes   ? Gestational diabetes   ? Heart murmur   ? ? ?  Patient Active Problem List  ? Diagnosis Date Noted  ? Left foot drop 01/02/2022  ? History of gestational diabetes 10/24/2021  ? Alpha thalassemia silent carrier 07/18/2021  ? Heart murmur   ? ? ?Past Surgical History:  ?Procedure Laterality Date  ? HYSTEROSCOPY  2014  ? thinks had few months after miscarriage  ? POLYPECTOMY  2020  ? removed polyp in uterus-[facility]  ? TUBAL LIGATION N/A 12/31/2021  ? Procedure: POST PARTUM TUBAL LIGATION;  Surgeon: Gwynne Edinger, MD;  Location: [facility] LD ORS;  Service: Gynecology;  Laterality: N/A;  ? ? ?Current Outpatient Medications  ?Medication Sig Dispense Refill  ? acetaminophen (TYLENOL) 500 MG tablet Take 2  tablets (1,000 mg total) by mouth every 8 (eight) hours as needed (pain). 60 tablet 0  ? Blood Pressure Monitoring (BLOOD PRESSURE KIT) DEVI 1 Device by Does not apply route as needed. 1 each 0  ? ibuprofen (ADVIL) 600 MG tablet Take 1 tablet (600 mg total) by mouth every 6 (six) hours as needed (pain). 40 tablet 0  ? oxyCODONE (OXY IR/ROXICODONE) 5 MG immediate release tablet Take 1 tablet (5 mg total) by mouth every 6 (six) hours as needed for severe pain or breakthrough pain. 12 tablet 0  ? Prenatal Vit-Fe Fumarate-FA (PRENATAL PO) Take by mouth.    ? furosemide (LASIX) 20 MG tablet Take 1 tablet (20 mg total) by mouth daily for 3 days. 3 tablet 0  ? ?No current facility-administered medications for this visit.  ? ? ?Allergies as of 01/27/2022 - Review Complete 01/27/2022  ?Allergen Reaction Noted  ? Penicillins Shortness Of Breath 05/27/2021  ? ? ?Vitals: ?BP 110/75   Pulse 70   Ht _0  (1.626 m)   Wt 145 lb (65.8 kg)   BMI 24.89 kg/m?  ?Last Weight:  ?Wt Readings from Last 1 Encounters:  ?01/27/22 145 lb (65.8 kg)  ? ?Last Height:   ?Ht Readings from Last 1 Encounters:  ?01/27/22 _1  (1.626 m)  ? ? ? ?Physical exam: ?Exam: ?Gen: NAD, conversant, well nourised, obese, well groomed                     ?CV: RRR, no MRG. No Carotid Bruits. No peripheral edema, warm, nontender ?Eyes: Conjunctivae clear without exudates or hemorrhage ? ?Neuro: ?Detailed Neurologic Exam ? ?Speech: ?   Speech is normal; fluent and spontaneous with normal comprehension.  ?Cognition: ?   The patient is oriented to person, place, and time;  ?   recent and remote memory intact;  ?   language fluent;  ?   normal attention, concentration,  ?   fund of knowledge ?Cranial Nerves: ?   The pupils are equal, round, and reactive to light. The fundi are normal and spontaneous venous pulsations are present. Visual fields are full to finger confrontation. Extraocular movements are intact. Trigeminal sensation is intact and the muscles of  mastication are normal. The face is symmetric. The palate elevates in the midline. Hearing intact. Voice is normal. Shoulder shrug is normal. The tongue has normal motion without fasciculations.  ? ?Coordination: ?   Normal finger to nose and heel to shin. Normal rapid alternating movements.  ? ?Gait: ?   Heel-toe and tandem gait are normal.  ? ?Motor Observation: ?   No asymmetry, no atrophy, and no involuntary movements noted. ?Tone: ?   Normal muscle tone.   ? ?Posture: ?   Posture is normal. normal erect ?   ?  Strength: weakness dorsiflexion, both inversion and eversion, and leg flexion, intact plantar flexion.  ?   Otherwise Strength is V/V in the upper and lower limbs.  ?    ?Sensation: decrease L5 distribution ?    ?Reflex Exam: ? ?DTR's: ?   Deep tendon reflexes in the upper and lower extremities are normal bilaterally.   ?Toes: ?   The toes are downgoing bilaterally.   ?Clonus: ?   Clonus is absent. ?  ? 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  I discussed etiologies with patient, MRI of the lumbar spine did not show any etiology or nerve impingement at L5-S1.   Other causes include l5 lumbar plexus sciatic involvement, lumbosacral plexus injury due to compression of the lumbosacral plexus against the fetal head in the second stage, could also be due to a common peroneal nerve injury due to positioning or prolonged lithotomy positioning.  Patient is improving, I did try to reassure her that this is one of the more common neurologic complications after pregnancy and it may take upwards of 3 months to improve, currently she is about a month from delivery and she is improving.  I reassured her its likely she will significantly improve.  If she does not continue to significantly improve in 6 to 12 weeks(or after PT) we should perform an EMG nerve conduction study.  At this time physical therapy is going to be  very important and I placed that order. ? ? ?Orders Placed This Encounter  ?Procedures  ? CK  ? Ambulatory referral to Physical Therapy  ? ?No orders of the defined types were placed in this encounter. ? ? ?Cc: Kennon Rounds

## 2022-01-27 NOTE — Patient Instructions (Addendum)
? ?  Provided literature off of the web for her to read ?Return after physical therapy if symptoms do not significantly improve and we can perform an emg/ncs ?

## 2022-01-29 ENCOUNTER — Other Ambulatory Visit: Payer: Self-pay

## 2022-01-29 ENCOUNTER — Encounter: Payer: Self-pay | Admitting: Family Medicine

## 2022-01-29 ENCOUNTER — Ambulatory Visit (INDEPENDENT_AMBULATORY_CARE_PROVIDER_SITE_OTHER): Payer: Medicaid Other | Admitting: Family Medicine

## 2022-01-29 ENCOUNTER — Other Ambulatory Visit: Payer: Medicaid Other

## 2022-01-29 DIAGNOSIS — Z8632 Personal history of gestational diabetes: Secondary | ICD-10-CM

## 2022-01-29 DIAGNOSIS — M21372 Foot drop, left foot: Secondary | ICD-10-CM | POA: Diagnosis not present

## 2022-01-29 NOTE — Progress Notes (Signed)
? ? ?Post Partum Visit Note ? ?Kaleah Hagemeister is a 41 y.o. [id number] female who presents for a postpartum visit. She is 4 weeks postpartum following a normal spontaneous vaginal delivery.  I have fully reviewed the prenatal and intrapartum course. The delivery was at [redacted]w[redacted]d gestational weeks.  Anesthesia: epidural. Postpartum course has been complicated by foot drop. Baby is doing well. Baby is feeding by  breast milk bottle fed, and formula fed . Bleeding staining only. Bowel function is normal. Bladder function is normal. Patient is not sexually active. Contraception method is tubal ligation. Postpartum depression screening: negative. ? ? Upstream - 01/29/22 1639   ? ?  ? Pregnancy Intention Screening  ? Does the patient want to become pregnant in the next year? No   ? Does the patient's partner want to become pregnant in the next year? No   ? Would the patient like to discuss contraceptive options today? No   ?  ? Contraception Wrap Up  ? Current Method Female Sterilization   ? End Method Female Sterilization   ? Contraception Counseling Provided Yes   ? ?  ?  ? ?  ? ?The pregnancy intention screening data noted above was reviewed. Potential methods of contraception were discussed. The patient elected to proceed with Female Sterilization. ? ? Edinburgh Postnatal Depression Scale - 01/29/22 1544   ? ?  ? Edinburgh Postnatal Depression Scale:  In the Past 7 Days  ? I have been able to laugh and see the funny side of things. 0   ? I have looked forward with enjoyment to things. 0   ? I have blamed myself unnecessarily when things went wrong. 1   ? I have been anxious or worried for no good reason. 0   ? I have felt scared or panicky for no good reason. 0   ? Things have been getting on top of me. 1   ? I have been so unhappy that I have had difficulty sleeping. 0   ? I have felt sad or miserable. 0   ? I have been so unhappy that I have been crying. 1   ? The thought of harming myself has occurred to me. 0   ? Edinburgh  Postnatal Depression Scale Total 3   ? ?  ?  ? ?  ? ? ?Health Maintenance Due  ?Topic Date Due  ? URINE MICROALBUMIN  Never done  ? ? ?The following portions of the patient's history were reviewed and updated as appropriate: allergies, current medications, past family history, past medical history, past social history, past surgical history, and problem list. ? ?Review of Systems ?Pertinent items are noted in HPI. ? ?Objective:  ?BP 109/71   Pulse 71   ? ?General:  alert, cooperative, and appears stated age  ? Breasts:  not indicated  ?Lungs: clear to auscultation bilaterally  ?Heart:  regular rate and rhythm, S1, S2 normal, no murmur, click, rub or gallop  ?Abdomen: soft, non-tender; bowel sounds normal; no masses,  no organomegaly   ?Wound well approximated incision, umbilical  ?GU exam:  not indicated  ?     ?Assessment:  ? ?Normal postpartum exam.  ? ?Plan:  ? ?Essential components of care per ACOG recommendations: ? ?1.  Mood and well being: Patient with negative depression screening today. Reviewed local resources for support.  ?- Patient tobacco use? No.   ?- hx of drug use? No.   ? ?2. Infant care and feeding:  ?-Patient currently  breastmilk feeding? Yes. Reviewed importance of draining breast regularly to support lactation.  ?-Social determinants of health (SDOH) reviewed in EPIC. No concerns ? ?3. Sexuality, contraception and birth spacing ?- Patient does not want a pregnancy in the next year.  Desired family size is 2 children.  ?- Reviewed reproductive life planning. Reviewed contraceptive methods based on pt preferences and effectiveness.  Patient desired Female Sterilization today.   ?- Discussed birth spacing of 18 months ? ?4. Sleep and fatigue ?-Encouraged family/partner/community support of 4 hrs of uninterrupted sleep to help with mood and fatigue ? ?5. Physical Recovery  ?- Discussed patients delivery and complications. She describes her labor as mixed. ?- Patient had a Vaginal, no problems at  delivery. Patient had a 2nd degree laceration. Perineal healing reviewed. Patient expressed understanding ?- Patient has urinary incontinence? No. ?- Patient is safe to resume physical and sexual activity ? ?6.  Health Maintenance ?- HM due items addressed Yes ?- Last pap smear  ?Diagnosis  ?Date Value Ref Range Status  ?06/18/2021   Final  ? - Negative for Intraepithelial Lesions or Malignancy (NILM)  ?06/18/2021 - Benign reactive/reparative changes  Final  ? Pap smear done at today's visit.  ?-Breast Cancer screening indicated? Yes- defer until after lactation is done ? ?7. Chronic Disease/Pregnancy Condition follow up:  history of GDM ?1. Postpartum exam ?normal ? ?2. Left foot drop ?Neuro follow up ? ?3. History of gestational diabetes ?Yearly HA1C screening indicated ? ?- PCP follow up ? ?Federico Flake, MD ?Center for [facility], [facility]  ?

## 2022-01-30 ENCOUNTER — Ambulatory Visit: Payer: Medicaid Other | Attending: Neurology

## 2022-01-30 DIAGNOSIS — M21372 Foot drop, left foot: Secondary | ICD-10-CM | POA: Insufficient documentation

## 2022-01-30 DIAGNOSIS — R2689 Other abnormalities of gait and mobility: Secondary | ICD-10-CM | POA: Insufficient documentation

## 2022-01-30 DIAGNOSIS — M6281 Muscle weakness (generalized): Secondary | ICD-10-CM | POA: Diagnosis present

## 2022-01-30 NOTE — Therapy (Signed)
?OUTPATIENT PHYSICAL THERAPY NEURO EVALUATION ? ? ?Patient Name: Crystal Boyd ?MRN: [id number] ?DOB:1981-02-07, 41 y.o., female ?Today's Date: 01/30/2022 ? ?PCP: Pcp, No ?REFERRING PROVIDER: Anson Fret, MD ? ? PT End of Session - 01/30/22 0946   ? ? Visit Number 1   ? Number of Visits 13   ? Date for PT Re-Evaluation 03/13/22   ? Authorization Type [employer] Medicaid (27 v max) no autho required >21 yrs old   ? PT Start Time 0845   ? PT Stop Time 0930   ? PT Time Calculation (min) 45 min   ? Activity Tolerance Patient tolerated treatment well   ? Behavior During Therapy [facility] for tasks assessed/performed   ? ?  ?  ? ?  ? ? ?Past Medical History:  ?Diagnosis Date  ? Anemia   ? Gestational diabetes   ? Gestational diabetes   ? Heart murmur   ? ?Past Surgical History:  ?Procedure Laterality Date  ? HYSTEROSCOPY  2014  ? thinks had few months after miscarriage  ? POLYPECTOMY  2020  ? removed polyp in uterus-[facility]  ? TUBAL LIGATION N/A 12/31/2021  ? Procedure: POST PARTUM TUBAL LIGATION;  Surgeon: Kathrynn Running, MD;  Location: [facility] LD ORS;  Service: Gynecology;  Laterality: N/A;  ? ?Patient Active Problem List  ? Diagnosis Date Noted  ? Left foot drop 01/02/2022  ? History of gestational diabetes 10/24/2021  ? Alpha thalassemia silent carrier 07/18/2021  ? Heart murmur   ? ? ?ONSET DATE: 12/31/21 ? ?REFERRING DIAG: M21.372 (ICD-10-CM) - Left foot drop  ? ?THERAPY DIAG:  ?Other abnormalities of gait and mobility ? ?Muscle weakness (generalized) ? ?Foot drop, left ? ?SUBJECTIVE:  ?                                                                                                                                                                                           ? ?SUBJECTIVE STATEMENT: ?During her dilation she was getting epidural and she felt her left leg jerk during the epidural. Left leg felt dead weight and numb during the labor. It took about 1 day to be able to put weight on it but it didn't feel  comfortable. She feels that leg is getting better. She feels numbness in 1st and 2nd toe and numbness on lateral lower leg. She reports that she is feeling pain in L shins, L lateral ankle, L posterior/proximal calf. Pt is wearing foot up brace as of last week. ?Pt accompanied by: self ? ?PERTINENT HISTORY: none ? ?PAIN:  ?Are you having pain? Yes: NPRS scale: 4-5/10 ?Pain location: L ankle, L  proximal  ?Pain description: sharp pain in ankle, dull pain in posterior knee ?Aggravating factors: standing. Walking, stairs ?Relieving factors: rest, sitting ? ?PRECAUTIONS: None ? ?WEIGHT BEARING RESTRICTIONS No ? ?FALLS: Has patient fallen in last 6 months? Yes, Number of falls: 1, lying bed and baby started crying, she got up and she collapsed on floor - this happened 3 weeks ago ? ?LIVING ENVIRONMENT: ?Lives with:  son and neece ?Lives in: House/apartment ?Stairs: Yes: Internal: 13 steps; on right going up and External: 8 steps; on right going up and on left going up ?Has following equipment at home: Dan Humphreys - 2 wheeled ? ?PLOF: Independent ? ?PATIENT GOALS Regain use of my left foot/ankle ? ?OBJECTIVE:  ? ?DIAGNOSTIC FINDINGS: IMPRESSION: ?1. No epidural fluid collection or other abnormality within the ?epidural space. ?2. Mild canal narrowing throughout the lumbar spine on the basis of ?congenitally short pedicles. Mild facet hypertrophy bilaterally at ?the L2-3 through L5-S1 levels without significant foraminal ?stenosis. ?  ? ?COGNITION: ?Overall cognitive status: Within functional limits for tasks assessed ?  ? ?POSTURE: No Significant postural limitations ? ?LE ROM:    ? ?Passive  Right ?01/30/2022 Left ?01/30/2022  ?Hip flexion    ?Hip extension    ?Hip abduction    ?Hip adduction    ?Hip internal rotation    ?Hip external rotation    ?Knee flexion    ?Knee extension    ?Ankle dorsiflexion 20 10  ?Ankle plantarflexion WNL WNL  ?Ankle inversion WNL WNL  ?Ankle eversion WNL WNL  ? (Blank rows = not tested) ? ?MMT:    ? ?MMT Right ?01/30/2022 Left ?01/30/2022  ?Hip flexion    ?Hip extension    ?Hip abduction    ?Hip adduction    ?Hip internal rotation    ?Hip external rotation    ?Knee flexion    ?Knee extension    ?Ankle dorsiflexion 5/5 0/5  ?Ankle plantarflexion 5/5 4/5  ?Ankle inversion 5/5 1/5  ?Ankle eversion 5/5 3+/5  ?(Blank rows = not tested) ? ?PALPATION:  tender over L anterior talofibular ligament ? ?Balance: ?Tandem stance: R leg behind: 30 sec, L leg behind 5 sec ? ?GAIT: ?Gait pattern: Assessed with pt wearing foot up brace (without foot up brace, pt has complete foot drop of L foot)- decreased cadence, decreased push off with L foot, ankle whip during swing to initial contact phase of gait with L foot ?Distance walked: 6' ?Assistive device utilized: None ?Level of assistance: Complete Independence ? ? ?TODAY'S TREATMENT:  ?Bil heel raises: 2 x 10 ?Uni heel riases: 10x ?Partial tandem rocking:50 ea ?- with left foot behind: cues to push off with Left foot ?- with left foot in front: cues to lift toes up when rocking back on R foot ?Tandem balance: 3 x 30" R and L ?Pt educated on healing time frames for nerves. Discussed bioness functional electrical stimulation and verified that patient doesn't have any precautions/contraindications for functional electrical stimulation. ?Pt educated on wearing a night splint (one that she can WB on) in case she has to wake up to attend the baby. ?Pt educated on wearing foot up brace/shoes at home to maintain 90 deg ankle DF to reduce contractures in her calf muscles  ? ? ? ?PATIENT EDUCATION: ?Education details: see above ?Person educated: Patient ?Education method: Explanation and Demonstration ?Education comprehension: verbalized understanding ? ? ?HOME EXERCISE PROGRAM: ?Access Code [email or web address] ? ? ? ?GOALS: ?Goals reviewed with patient? Yes ? ?SHORT TERM GOALS: Target date:  02/20/2022 ? ?Patient will be able to perform tandem balance for 20 sec with left leg behind to improve  balance ?Baseline: 5 sec (01/30/22) ?Goal status: INITIAL ? ?2.  Pt will demo 20 deg of PROM of L ankle to improve ankle flexibility during gait ?Baseline: 10 deg (01/30/22) ?Goal status: INITIAL ? ?3.  Pt will be compliant with wearing foot up brace at all times during the day and compliant with night splint to maintain tissue flexibility in left ankle ?Baseline: education provided (01/30/22) ?Goal status: INITIAL ? ?LONG TERM GOALS: Target date: 03/13/2022 ? ?Pt will be able to ambulate for 30 min (3x/day) without any significant ankle pain. ?Baseline: 4/10 with 5 min of standing/walking pain in ankle (01/30/22) ?Goal status: INITIAL ? ?2.  Pt will demo 2+/5 strength in Left ankle dorsiflexion to improve active dorsiflexion during swing phase of gait to improve safety and reduce fall ?Baseline: 0/5 (01/30/22) ?Goal status: INITIAL ? ?3.  ?ASSESSMENT: ? ?CLINICAL IMPRESSION: ?Patient is a 41 y.o. female who was seen today for physical therapy evaluation and treatment for L foot drop.  ? ? ?OBJECTIVE IMPAIRMENTS Abnormal gait, decreased activity tolerance, decreased balance, decreased endurance, decreased mobility, decreased ROM, decreased strength, hypomobility, increased fascial restrictions, increased muscle spasms, impaired flexibility, impaired sensation, impaired tone, and pain.  ? ?ACTIVITY LIMITATIONS cleaning, driving, meal prep, laundry, and yard work.  ? ?PERSONAL FACTORS Time since onset of injury/illness/exacerbation are also affecting patient's functional outcome.  ? ? ?REHAB POTENTIAL: Good ? ?CLINICAL DECISION MAKING: Stable/uncomplicated ? ?EVALUATION COMPLEXITY: Low ? ?PLAN: ?PT FREQUENCY: 2x/week ? ?PT DURATION: 6 weeks ? ?PLANNED INTERVENTIONS: Therapeutic exercises, Therapeutic activity, Neuromuscular re-education, Balance training, Gait training, Patient/Family education, Joint manipulation, Joint mobilization, Stair training, Orthotic/Fit training, Electrical stimulation, Cryotherapy, Moist heat,  Ultrasound, and Manual therapy ? ?PLAN FOR NEXT SESSION: Try Bioness E-stim  ? ? ?Ileana LaddKarmesh N Nollie Shiflett, PT ?01/30/2022, 9:47 AM ? ? ? ? ? ? ? ?

## 2022-02-01 ENCOUNTER — Encounter: Payer: Self-pay | Admitting: Neurology

## 2022-02-02 ENCOUNTER — Other Ambulatory Visit: Payer: Self-pay

## 2022-02-02 ENCOUNTER — Other Ambulatory Visit: Payer: Self-pay | Admitting: General Practice

## 2022-02-02 ENCOUNTER — Other Ambulatory Visit: Payer: Medicaid Other

## 2022-02-02 DIAGNOSIS — Z8632 Personal history of gestational diabetes: Secondary | ICD-10-CM | POA: Diagnosis not present

## 2022-02-03 LAB — GLUCOSE TOLERANCE, 2 HOURS
Glucose, 2 hour: 92 mg/dL (ref 70–139)
Glucose, GTT - Fasting: 78 mg/dL (ref 70–99)

## 2022-02-05 ENCOUNTER — Encounter: Payer: Self-pay | Admitting: Physical Therapy

## 2022-02-05 ENCOUNTER — Ambulatory Visit: Payer: Medicaid Other | Admitting: Physical Therapy

## 2022-02-05 DIAGNOSIS — R2689 Other abnormalities of gait and mobility: Secondary | ICD-10-CM | POA: Diagnosis not present

## 2022-02-05 DIAGNOSIS — M21372 Foot drop, left foot: Secondary | ICD-10-CM

## 2022-02-05 DIAGNOSIS — M6281 Muscle weakness (generalized): Secondary | ICD-10-CM

## 2022-02-05 NOTE — Therapy (Signed)
?OUTPATIENT PHYSICAL THERAPY TREATMENT NOTE ? ? ?Patient Name: Crystal Boyd ?MRN: [id number] ?DOB:23-May-1981, 41 y.o., female ?Today's Date: 02/05/2022 ? ?PCP: Pcp, No ?REFERRING PROVIDER: Anson Fret, MD ? ? PT End of Session - 02/05/22 0850   ? ? Visit Number 2   ? Number of Visits 13   ? Date for PT Re-Evaluation 03/13/22   ? Authorization Type [employer] Medicaid (27 v max) no autho required >21 yrs old   ? PT Start Time [phone]   ? PT Stop Time 0926   ? PT Time Calculation (min) 39 min   ? Equipment Utilized During Treatment Other (comment)   Bioness  ? Activity Tolerance Patient tolerated treatment well   ? Behavior During Therapy [facility] for tasks assessed/performed   ? ?  ?  ? ?  ? ? ?Past Medical History:  ?Diagnosis Date  ? Anemia   ? Gestational diabetes   ? Gestational diabetes   ? Heart murmur   ? ?Past Surgical History:  ?Procedure Laterality Date  ? HYSTEROSCOPY  2014  ? thinks had few months after miscarriage  ? POLYPECTOMY  2020  ? removed polyp in uterus-[facility]  ? TUBAL LIGATION N/A 12/31/2021  ? Procedure: POST PARTUM TUBAL LIGATION;  Surgeon: Kathrynn Running, MD;  Location: [facility] LD ORS;  Service: Gynecology;  Laterality: N/A;  ? ?Patient Active Problem List  ? Diagnosis Date Noted  ? Left foot drop 01/02/2022  ? History of gestational diabetes 10/24/2021  ? Alpha thalassemia silent carrier 07/18/2021  ? Heart murmur   ? ? ?REFERRING DIAG: M21.372 (ICD-10-CM) - Left foot drop  ? ?THERAPY DIAG:  ?Other abnormalities of gait and mobility ? ?Muscle weakness (generalized) ? ?Foot drop, left ? ?PERTINENT HISTORY: None  ? ?PRECAUTIONS: none  ? ?SUBJECTIVE: Reports less numbness in left LE, now with more feeling at lateral aspect of calf near knee ? ?PAIN:  ?Are you having pain? Yes ?NPRS scale: 3-4/10 ?Pain location: left lower leg ?Pain orientation: Left and Lower  ?PAIN TYPE: Acute, Neuropathic ?Pain description: intermittent, burning, tingling, and numbness   ?Aggravating factors: unknown ?Relieving  factors: unknown  ? ? ?TODAY'S TREATMENT:  ? [facility] Adult PT Treatment/Exercise - 02/05/22 6568   ? ?  ? Electrical Stimulation  ? Electrical Stimulation Location left anterior tibialis   ? Electrical Stimulation Action for increased muslce/nerve activation in both open and closed chain   ? Electrical Stimulation Parameters refer to tablet 1 for adjusted parameters; quick fit electrodes   ? Electrical Stimulation Goals Strength;Neuromuscular facilitation   ? ?  ?  ? ? ?GAIT: ?Gait pattern: step through pattern, decreased stance time- Left, decreased stride length, and decreased ankle dorsiflexion- Left ?Distance walked: 115 ?Assistive device utilized:  Bioness to left anterior tib ?Level of assistance:  min guard to supervision ?Comments: gait in session concurrent with Bioness to left anterior tib with good DF response noted.     ? ?NMR- CONCURRENT WITH BIONESS ?Side Stepping: on blue foam beam for 4 laps toward each way with Bioness in gait mode. Intermittent touch to bars for balance. Min guard assist for safety.  ?Tandem Walking: on blue foam beam for 4 laps each forward/backwards with light touch on bars for balance, min guard assist for safety with Bioness in gait mode ?Rockerboard: seated with foot on balance board in anterior/posterior direction - assisted DF with on time of 5 seconds, rest time of 8 seconds for 5 minutes.    ?Standing across red foam  beam with Bioness in training mode- left heel tap to floor with on time of 5 seconds, resting in neutral position on balance beam for 10 reps. UE support on bars with min guard assist for safety.  ?Alternating forward lunges with Bioness in training mode for 8 seconds on, rest for 10 seconds. Lunging forward and back with on times. 10 reps each side with min guard assist for safety.  ?  ?  ?PATIENT EDUCATION: ?Education details: purpose and use of Bioness with sessions ?Person educated: Patient ?Education method: Explanation and Demonstration ?Education  comprehension: verbalized understanding ?  ?  ?HOME EXERCISE PROGRAM: ?Access Code [email or web address] ?  ?  ?  ?GOALS: ?Goals reviewed with patient? Yes ?  ?SHORT TERM GOALS: Target date: 02/20/2022 ?  ?Patient will be able to perform tandem balance for 20 sec with left leg behind to improve balance ?Baseline: 5 sec (01/30/22) ?Goal status: INITIAL ?  ?2.  Pt will demo 20 deg of PROM of L ankle to improve ankle flexibility during gait ?Baseline: 10 deg (01/30/22) ?Goal status: INITIAL ?  ?3.  Pt will be compliant with wearing foot up brace at all times during the day and compliant with night splint to maintain tissue flexibility in left ankle ?Baseline: education provided (01/30/22) ?Goal status: INITIAL ?  ?LONG TERM GOALS: Target date: 03/13/2022 ?  ?Pt will be able to ambulate for 30 min (3x/day) without any significant ankle pain. ?Baseline: 4/10 with 5 min of standing/walking pain in ankle (01/30/22) ?Goal status: INITIAL ?  ?2.  Pt will demo 2+/5 strength in Left ankle dorsiflexion to improve active dorsiflexion during swing phase of gait to improve safety and reduce fall ?Baseline: 0/5 (01/30/22) ?Goal status: INITIAL ?  ? ? ? ?ASSESSMENT: ?  ?CLINICAL IMPRESSION: Today's skilled session focused on set up of Bioness to left LE for use with gait and strengthening/balance ex's to address left LE weakness. Pt with good response to Bioness with no issues noted or reported at end of session. The pt is progressing toward goals and should benefit from continued PT to progress toward unmet goals.  ? ?  ?  ?OBJECTIVE IMPAIRMENTS Abnormal gait, decreased activity tolerance, decreased balance, decreased endurance, decreased mobility, decreased ROM, decreased strength, hypomobility, increased fascial restrictions, increased muscle spasms, impaired flexibility, impaired sensation, impaired tone, and pain.  ?  ?ACTIVITY LIMITATIONS cleaning, driving, meal prep, laundry, and yard work.  ?  ?PERSONAL FACTORS Time since onset of  injury/illness/exacerbation are also affecting patient's functional outcome.  ?  ?  ?REHAB POTENTIAL: Good ?  ?CLINICAL DECISION MAKING: Stable/uncomplicated ?  ?EVALUATION COMPLEXITY: Low ?  ?PLAN: ?PT FREQUENCY: 2x/week ?  ?PT DURATION: 6 weeks ?  ?PLANNED INTERVENTIONS: Therapeutic exercises, Therapeutic activity, Neuromuscular re-education, Balance training, Gait training, Patient/Family education, Joint manipulation, Joint mobilization, Stair training, Orthotic/Fit training, Electrical stimulation, Cryotherapy, Moist heat, Ultrasound, and Manual therapy ?  ?PLAN FOR NEXT SESSION: Continue with use of Bioness to left LE for strengthening/NMR; if with provider not familiar with Bioness continued to work on ankle strengthening and stability in balance, especially on compliant surfaces with and without use of foot up brace ? ? ? ? ?Sallyanne Kuster, PTA, CLT ?[facility] ?[address], [address] ?[place], [place] [address] ?[phone] ?02/05/22, 9:25 AM  ?   ?

## 2022-02-10 ENCOUNTER — Ambulatory Visit: Payer: Medicaid Other | Attending: Neurology | Admitting: Physical Therapy

## 2022-02-10 ENCOUNTER — Encounter: Payer: Self-pay | Admitting: Physical Therapy

## 2022-02-10 DIAGNOSIS — M21372 Foot drop, left foot: Secondary | ICD-10-CM | POA: Insufficient documentation

## 2022-02-10 DIAGNOSIS — R2689 Other abnormalities of gait and mobility: Secondary | ICD-10-CM | POA: Diagnosis present

## 2022-02-10 DIAGNOSIS — M6281 Muscle weakness (generalized): Secondary | ICD-10-CM | POA: Insufficient documentation

## 2022-02-10 NOTE — Therapy (Signed)
?OUTPATIENT PHYSICAL THERAPY TREATMENT NOTE ? ? ?Patient Name: Crystal Boyd ?MRN: [id number] ?DOB:02/10/1981, 41 y.o., female ?Today's Date: 02/10/2022 ? ?PCP: Pcp, No ?REFERRING PROVIDER: Anson Fret, MD ? ? PT End of Session - 02/10/22 0854   ? ? Visit Number 3   ? Number of Visits 13   ? Date for PT Re-Evaluation 03/13/22   ? Authorization Type [employer] Medicaid (27 v max) no autho required >21 yrs old   ? PT Start Time [id number]   running late for session due to emergency with dog and dropping off at vet  ? PT Stop Time 0915   needed to leave early due to emergency with dog at vet  ? PT Time Calculation (min) 23 min   ? Equipment Utilized During Treatment Other (comment)   Bioness  ? Activity Tolerance Patient tolerated treatment well   ? Behavior During Therapy [facility] for tasks assessed/performed   ? ?  ?  ? ?  ? ? ?Past Medical History:  ?Diagnosis Date  ? Anemia   ? Gestational diabetes   ? Gestational diabetes   ? Heart murmur   ? ?Past Surgical History:  ?Procedure Laterality Date  ? HYSTEROSCOPY  2014  ? thinks had few months after miscarriage  ? POLYPECTOMY  2020  ? removed polyp in uterus-[facility]  ? TUBAL LIGATION N/A 12/31/2021  ? Procedure: POST PARTUM TUBAL LIGATION;  Surgeon: Kathrynn Running, MD;  Location: [facility] LD ORS;  Service: Gynecology;  Laterality: N/A;  ? ?Patient Active Problem List  ? Diagnosis Date Noted  ? Left foot drop 01/02/2022  ? History of gestational diabetes 10/24/2021  ? Alpha thalassemia silent carrier 07/18/2021  ? Heart murmur   ? ? ?REFERRING DIAG: M21.372 (ICD-10-CM) - Left foot drop  ? ?THERAPY DIAG:  ?Other abnormalities of gait and mobility ? ?Muscle weakness (generalized) ? ?Foot drop, left ? ?PERTINENT HISTORY: None  ? ?PRECAUTIONS: none  ? ?SUBJECTIVE: Reports less numbness in left LE, now with more feeling at lateral aspect of calf near knee and able to lift one of her toes on the left foot. Still nothing with her big toe.  ? ?PAIN:  ?Are you having pain? Yes ?NPRS scale:  3-4/10 ?Pain location: left lower leg ?Pain orientation: Left and Lower  ?PAIN TYPE: Acute, Neuropathic ?Pain description: intermittent, burning, tingling, and numbness   ?Aggravating factors: unknown ?Relieving factors: unknown  ? ? ?TODAY'S TREATMENT:  ? 02/10/2022 ? [facility] Adult PT Treatment/Exercise   ? ?  ? Electrical Stimulation  ? Electrical Stimulation Location left anterior tibialis   ? Electrical Stimulation Action for increased muslce/nerve activation in both open and closed chain   ? Electrical Stimulation Parameters refer to tablet 1 for adjusted parameters; quick fit electrodes   ? Electrical Stimulation Goals Strength;Neuromuscular facilitation   ? ?  ?  ? ? ?GAIT: ?Gait pattern: step through pattern, decreased stance time- Left, decreased stride length, and decreased ankle dorsiflexion- Left ?Distance walked: 115 x1, plus around clinic with session ?Assistive device utilized:  Bioness to left anterior tib ?Level of assistance:  min guard to supervision ?Comments: gait in session concurrent with Bioness to left anterior tib with good DF response noted.     ? ?NMR- CONCURRENT WITH BIONESS ?Seated at edge of mat with foot on fitter board: assisted DF with on time of 5 seconds, rest for 7 seconds x 6 minutes ?Standing at bottom of steps: heel taps up/down bottom 2 steps with on time of  6-8 seconds (increased time needed as pt's leg fatigued) for several reps, rest of 8-10 seconds between reps ?Seated at edge of mat with hands on seat of chair in front of pt- hovering over mat in low squat for 6 second holds x several reps with rest of 9 seconds.  ? ?  ?  ?PATIENT EDUCATION: ?Education details: continue with current HEP ?Person educated: Patient ?Education method: Explanation and Demonstration ?Education comprehension: verbalized understanding ?  ?  ?HOME EXERCISE PROGRAM: ?Access Code [email or web address] ?  ?  ?  ?GOALS: ?Goals reviewed with patient? Yes ?  ?SHORT TERM GOALS: Target date: 02/20/2022 ?  ?Patient will be  able to perform tandem balance for 20 sec with left leg behind to improve balance ?Baseline: 5 sec (01/30/22) ?Goal status: INITIAL ?  ?2.  Pt will demo 20 deg of PROM of L ankle to improve ankle flexibility during gait ?Baseline: 10 deg (01/30/22) ?Goal status: INITIAL ?  ?3.  Pt will be compliant with wearing foot up brace at all times during the day and compliant with night splint to maintain tissue flexibility in left ankle ?Baseline: education provided (01/30/22) ?Goal status: INITIAL ?  ?LONG TERM GOALS: Target date: 03/13/2022 ?  ?Pt will be able to ambulate for 30 min (3x/day) without any significant ankle pain. ?Baseline: 4/10 with 5 min of standing/walking pain in ankle (01/30/22) ?Goal status: INITIAL ?  ?2.  Pt will demo 2+/5 strength in Left ankle dorsiflexion to improve active dorsiflexion during swing phase of gait to improve safety and reduce fall ?Baseline: 0/5 (01/30/22) ?Goal status: INITIAL ?  ? ? ? ?ASSESSMENT: ?  ?CLINICAL IMPRESSION: Today's skilled session continued to focus on use of Bioness concurrent with gait and strengthening/balance tasks with rest breaks taken as needed due to LE fatigue. Session limited in time at pt request due to pet emergency with her dog this am making her late and need to get back to vet office. The pt is progressing and should benefit from continued PT to progress toward unmet goals.  ? ?  ?  ?OBJECTIVE IMPAIRMENTS Abnormal gait, decreased activity tolerance, decreased balance, decreased endurance, decreased mobility, decreased ROM, decreased strength, hypomobility, increased fascial restrictions, increased muscle spasms, impaired flexibility, impaired sensation, impaired tone, and pain.  ?  ?ACTIVITY LIMITATIONS cleaning, driving, meal prep, laundry, and yard work.  ?  ?PERSONAL FACTORS Time since onset of injury/illness/exacerbation are also affecting patient's functional outcome.  ?  ?  ?REHAB POTENTIAL: Good ?  ?CLINICAL DECISION MAKING: Stable/uncomplicated ?   ?EVALUATION COMPLEXITY: Low ?  ?PLAN: ?PT FREQUENCY: 2x/week ?  ?PT DURATION: 6 weeks ?  ?PLANNED INTERVENTIONS: Therapeutic exercises, Therapeutic activity, Neuromuscular re-education, Balance training, Gait training, Patient/Family education, Joint manipulation, Joint mobilization, Stair training, Orthotic/Fit training, Electrical stimulation, Cryotherapy, Moist heat, Ultrasound, and Manual therapy ?  ?PLAN FOR NEXT SESSION: Continue with use of Bioness to left LE for strengthening/NMR; if with provider not familiar with Bioness continued to work on ankle strengthening and stability in balance, especially on compliant surfaces with and without use of foot up brace. ? ? ? ? ?Sallyanne Kuster, PTA, CLT ?[facility] ?[address], [address] ?[place], [place] [address] ?[phone] ?02/10/22, 3:21 PM  ?   ?

## 2022-02-11 ENCOUNTER — Telehealth: Payer: Medicaid Other | Admitting: Certified Nurse Midwife

## 2022-02-12 ENCOUNTER — Ambulatory Visit: Payer: Medicaid Other | Admitting: Physical Therapy

## 2022-02-12 ENCOUNTER — Encounter: Payer: Self-pay | Admitting: Physical Therapy

## 2022-02-12 DIAGNOSIS — R2689 Other abnormalities of gait and mobility: Secondary | ICD-10-CM | POA: Diagnosis not present

## 2022-02-12 DIAGNOSIS — M21372 Foot drop, left foot: Secondary | ICD-10-CM

## 2022-02-12 DIAGNOSIS — M6281 Muscle weakness (generalized): Secondary | ICD-10-CM

## 2022-02-12 NOTE — Therapy (Signed)
?OUTPATIENT PHYSICAL THERAPY TREATMENT NOTE ? ? ?Patient Name: Crystal Boyd ?MRN: [id number] ?DOB:August 01, 1981, 41 y.o., female ?Today's Date: 02/12/2022 ? ?PCP: Pcp, No ?REFERRING PROVIDER: Anson Fret, MD ? ? PT End of Session - 02/12/22 0935   ? ? Visit Number 4   ? Number of Visits 13   ? Date for PT Re-Evaluation 03/13/22   ? Authorization Type [employer] Medicaid (27 v max) no autho required >21 yrs old   ? PT Start Time [phone]   ? PT Stop Time 1013   ? PT Time Calculation (min) 40 min   ? Equipment Utilized During Treatment Other (comment)   Bioness  ? Activity Tolerance Patient tolerated treatment well   ? Behavior During Therapy [facility] for tasks assessed/performed   ? ?  ?  ? ?  ? ? ?Past Medical History:  ?Diagnosis Date  ? Anemia   ? Gestational diabetes   ? Gestational diabetes   ? Heart murmur   ? ?Past Surgical History:  ?Procedure Laterality Date  ? HYSTEROSCOPY  2014  ? thinks had few months after miscarriage  ? POLYPECTOMY  2020  ? removed polyp in uterus-[facility]  ? TUBAL LIGATION N/A 12/31/2021  ? Procedure: POST PARTUM TUBAL LIGATION;  Surgeon: Kathrynn Running, MD;  Location: [facility] LD ORS;  Service: Gynecology;  Laterality: N/A;  ? ?Patient Active Problem List  ? Diagnosis Date Noted  ? Left foot drop 01/02/2022  ? History of gestational diabetes 10/24/2021  ? Alpha thalassemia silent carrier 07/18/2021  ? Heart murmur   ? ? ?REFERRING DIAG: M21.372 (ICD-10-CM) - Left foot drop  ? ?THERAPY DIAG:  ?Other abnormalities of gait and mobility ? ?Foot drop, left ? ?Muscle weakness (generalized) ? ?PERTINENT HISTORY: None  ? ?PRECAUTIONS: none  ? ?SUBJECTIVE: Not as sore after last session as the session before. No new complaints.  ? ?PAIN:  ?Are you having pain? Yes ?NPRS scale: 2-3/10 ?Pain location: left lower leg ?Pain orientation: Left and Lower  ?PAIN TYPE: Acute, Neuropathic ?Pain description: intermittent, burning, tingling, and numbness   ?Aggravating factors: unknown ?Relieving factors:  unknown ? ? ?TODAY'S TREATMENT:  ? 02/12/2022 ? [facility] Adult PT Treatment/Exercise   ? ?  ? Electrical Stimulation  ? Electrical Stimulation Location left anterior tibialis   ? Electrical Stimulation Action for increased muslce/nerve activation in both open and closed chain   ? Electrical Stimulation Parameters refer to tablet 1 for adjusted parameters; quick fit electrodes   ? Electrical Stimulation Goals Strength;Neuromuscular facilitation   ? ?  ?  ? ? ?GAIT: ?Gait pattern: step through pattern, decreased stance time- Left, decreased stride length, and decreased ankle dorsiflexion- Left ?Distance walked:  230 x 2 reps, plus around clinic with session ?Assistive device utilized:  Bioness to left anterior tib ?Level of assistance:  supervision ?Comments: gait in session concurrent with Bioness to left anterior tib with good DF response noted.  Cues for increased step length to allow Bioness to fully react to tilt with toe off to swing phase   ? ?NMR- CONCURRENT WITH BIONESS ?Seated at edge of mat with foot on fitter board: assisted DF with on time of 5 seconds, rest for 7 seconds x 6 minutes ?Standing on balance board in anterior/posterior direction: in training mode: assisted DF with on times, rest with off times for ~4-5 minutes. Then left heel taps to floor with on times, resting in neutral on balance board with off times for several reps. Attempted to alternating heel  taps to floor, however pt with increased pain in left LE with stance on board for right heel taps. Then attempted to have pt tap right toe back vs forward heel taps with increased discomfort reported.  ?On blue foam beam: side stepping left<>right, then tandem gait forward with heel taps to floor before placing foot on beam ? ?  ?  ?PATIENT EDUCATION: ?Education details: continue with current HEP ?Person educated: Patient ?Education method: Explanation and Demonstration ?Education comprehension: verbalized understanding ?  ?  ?HOME EXERCISE  PROGRAM: ?Access Code [email or web address] ?  ?  ?  ?GOALS: ?Goals reviewed with patient? Yes ?  ?SHORT TERM GOALS: Target date: 02/20/2022 ?  ?Patient will be able to perform tandem balance for 20 sec with left leg behind to improve balance ?Baseline: 5 sec (01/30/22) ?Goal status: INITIAL ?  ?2.  Pt will demo 20 deg of PROM of L ankle to improve ankle flexibility during gait ?Baseline: 10 deg (01/30/22) ?Goal status: INITIAL ?  ?3.  Pt will be compliant with wearing foot up brace at all times during the day and compliant with night splint to maintain tissue flexibility in left ankle ?Baseline: education provided (01/30/22) ?Goal status: INITIAL ?  ?LONG TERM GOALS: Target date: 03/13/2022 ?  ?Pt will be able to ambulate for 30 min (3x/day) without any significant ankle pain. ?Baseline: 4/10 with 5 min of standing/walking pain in ankle (01/30/22) ?Goal status: INITIAL ?  ?2.  Pt will demo 2+/5 strength in Left ankle dorsiflexion to improve active dorsiflexion during swing phase of gait to improve safety and reduce fall ?Baseline: 0/5 (01/30/22) ?Goal status: INITIAL ?  ? ? ? ?ASSESSMENT: ?  ?CLINICAL IMPRESSION: Today's skilled session continued to focus on gait and strengthening with use of Bioness to left LE with no issues noted or reported in session. No issues noted or reported in session. The pt is making progress toward goals and should benefit from continued PT to progress toward unmet goals.  ? ?  ?  ?OBJECTIVE IMPAIRMENTS Abnormal gait, decreased activity tolerance, decreased balance, decreased endurance, decreased mobility, decreased ROM, decreased strength, hypomobility, increased fascial restrictions, increased muscle spasms, impaired flexibility, impaired sensation, impaired tone, and pain.  ?  ?ACTIVITY LIMITATIONS cleaning, driving, meal prep, laundry, and yard work.  ?  ?PERSONAL FACTORS Time since onset of injury/illness/exacerbation are also affecting patient's functional outcome.  ?  ?  ?REHAB POTENTIAL: Good ?   ?CLINICAL DECISION MAKING: Stable/uncomplicated ?  ?EVALUATION COMPLEXITY: Low ?  ?PLAN: ?PT FREQUENCY: 2x/week ?  ?PT DURATION: 6 weeks ?  ?PLANNED INTERVENTIONS: Therapeutic exercises, Therapeutic activity, Neuromuscular re-education, Balance training, Gait training, Patient/Family education, Joint manipulation, Joint mobilization, Stair training, Orthotic/Fit training, Electrical stimulation, Cryotherapy, Moist heat, Ultrasound, and Manual therapy ?  ?PLAN FOR NEXT SESSION: Continue with use of Bioness to left LE for strengthening/NMR; if with provider not familiar with Bioness continued to work on ankle strengthening and stability in balance, especially on compliant surfaces with and without use of foot up brace. Pt also reporting weakness with lifting left LE up (can lift up thigh, however lower leg does not lift) when in right side lying- work on hip/glut strengthening ? ? ? ? ?Sallyanne Kuster, PTA, CLT ?[facility] ?[address], [address] ?[place], [place] [address] ?[phone] ?02/12/22, 2:02 PM  ?   ?

## 2022-02-20 ENCOUNTER — Ambulatory Visit: Payer: Medicaid Other

## 2022-02-20 DIAGNOSIS — M21372 Foot drop, left foot: Secondary | ICD-10-CM

## 2022-02-20 DIAGNOSIS — R2689 Other abnormalities of gait and mobility: Secondary | ICD-10-CM

## 2022-02-20 DIAGNOSIS — M6281 Muscle weakness (generalized): Secondary | ICD-10-CM

## 2022-02-20 NOTE — Therapy (Signed)
?OUTPATIENT PHYSICAL THERAPY TREATMENT NOTE ? ? ?Patient Name: Crystal Boyd ?MRN: [id number] ?DOB:1981-05-23, 41 y.o., female ?Today's Date: 02/20/2022 ? ?PCP: Pcp, No ?REFERRING PROVIDER: Melvenia Beam, MD ? ? PT End of Session - 02/20/22 0855   ? ? Visit Number 5   ? Number of Visits 13   ? Date for PT Re-Evaluation 03/13/22   ? Authorization Type [employer] Medicaid (27 v max) no autho required >21 yrs old   ? PT Start Time 0845   ? PT Stop Time 0930   ? PT Time Calculation (min) 45 min   ? Equipment Utilized During Treatment Other (comment)   Bioness  ? Activity Tolerance Patient tolerated treatment well   ? Behavior During Therapy [facility] for tasks assessed/performed   ? ?  ?  ? ?  ? ? ? ?Past Medical History:  ?Diagnosis Date  ? Anemia   ? Gestational diabetes   ? Gestational diabetes   ? Heart murmur   ? ?Past Surgical History:  ?Procedure Laterality Date  ? HYSTEROSCOPY  2014  ? thinks had few months after miscarriage  ? POLYPECTOMY  2020  ? removed polyp in uterus-[facility]  ? TUBAL LIGATION N/A 12/31/2021  ? Procedure: POST PARTUM TUBAL LIGATION;  Surgeon: Gwynne Edinger, MD;  Location: [facility] LD ORS;  Service: Gynecology;  Laterality: N/A;  ? ?Patient Active Problem List  ? Diagnosis Date Noted  ? Left foot drop 01/02/2022  ? History of gestational diabetes 10/24/2021  ? Alpha thalassemia silent carrier 07/18/2021  ? Heart murmur   ? ? ?REFERRING DIAG: M21.372 (ICD-10-CM) - Left foot drop  ? ?THERAPY DIAG:  ?Foot drop, left ? ?Muscle weakness (generalized) ? ?Other abnormalities of gait and mobility ? ?PERTINENT HISTORY: None  ? ?PRECAUTIONS: none  ? ?SUBJECTIVE: I am little tired. I have been wearing foot up brace during the day and night brace at night. I can only wear night splint for about 1 hour and then I have to take it off. ? ?PAIN:  ?Are you having pain? Yes ?NPRS scale: 2-3/10 ?Pain location: left lower leg, shin, L lateral ankle ?Pain orientation: Left and Lower  ?PAIN TYPE: Acute, Neuropathic ?Pain  description: intermittent, burning, tingling, and numbness   ?Aggravating factors: unknown ?Relieving factors: unknown ? ? ?TODAY'S TREATMENT:  ? ?LE ROM:    ?  ?Passive / active Right ?01/30/2022 Left ?01/30/2022 Left ?02/20/22  ?Hip flexion       ?Hip extension       ?Hip abduction       ?Hip adduction       ?Hip internal rotation       ?Hip external rotation       ?Knee flexion       ?Knee extension       ?Ankle dorsiflexion 20 10 15/-43  ?Ankle plantarflexion WNL WNL   ?Ankle inversion WNL WNL   ?Ankle eversion WNL WNL   ? (Blank rows = not tested) ?  ?MMT:   ?  ?MMT Right ?01/30/2022 Left ?01/30/2022 Left ?02/20/22 Right ?02/20/22  ?Hip flexion     5 5  ?Hip extension     3+ 5  ?Hip abduction     3- 5  ?Hip adduction        ?Hip internal rotation     3- 5  ?Hip external rotation     3- 5  ?Knee flexion        ?Knee extension        ?  Ankle dorsiflexion 5/5 0/5    ?Ankle plantarflexion 5/5 4/5    ?Ankle inversion 5/5 1/5    ?Ankle eversion 5/5 3+/5    ?(Blank rows = not tested) ? ? ?02/20/22: ?Pt educated on wearing night splint for 1-2 hours before she wakes up so she feels improved flexibility in the morning. ?Supine piriformis stretch: 3 x 30" L only ?SL clamshells: 2 x 10 R and L ?SL reverse clamshells: 2 x 10 R and L ?Prone hip extensions: 2 x 10 R and L, cues to squeeze gluts before lifting the leg. ?Supine hooklying double leg lift with slow eccentric lowering: 2 x 10  ?Treadmill walking: ?- 1.3 mph fwd direction no HHA: 5' ?- 0.5 mph walking lateral: 2' each way R and L ?- 0.8 mph walking backwards with bil HHA; 5' ? ?02/12/2022 ? [place] [facility] Adult PT Treatment/Exercise   ? ?  ? Electrical Stimulation  ? Electrical Stimulation Location left anterior tibialis   ? Electrical Stimulation Action for increased muslce/nerve activation in both open and closed chain   ? Electrical Stimulation Parameters refer to tablet 1 for adjusted parameters; quick fit electrodes   ? Electrical Stimulation Goals Strength;Neuromuscular  facilitation   ? ?  ?  ? ? ?GAIT: ?Gait pattern: step through pattern, decreased stance time- Left, decreased stride length, and decreased ankle dorsiflexion- Left ?Distance walked:  230 x 2 reps, plus around clinic with session ?Assistive device utilized:  Bioness to left anterior tib ?Level of assistance:  supervision ?Comments: gait in session concurrent with Bioness to left anterior tib with good DF response noted.  Cues for increased step length to allow Bioness to fully react to tilt with toe off to swing phase   ? ?NMR- CONCURRENT WITH BIONESS ?Seated at edge of mat with foot on fitter board: assisted DF with on time of 5 seconds, rest for 7 seconds x 6 minutes ?Standing on balance board in anterior/posterior direction: in training mode: assisted DF with on times, rest with off times for ~4-5 minutes. Then left heel taps to floor with on times, resting in neutral on balance board with off times for several reps. Attempted to alternating heel taps to floor, however pt with increased pain in left LE with stance on board for right heel taps. Then attempted to have pt tap right toe back vs forward heel taps with increased discomfort reported.  ?On blue foam beam: side stepping left<>right, then tandem gait forward with heel taps to floor before placing foot on beam ? ?  ?  ?PATIENT EDUCATION: ?Education details: see above ?Person educated: Patient ?Education method: Explanation and Demonstration ?Education comprehension: verbalized understanding ?  ?  ?HOME EXERCISE PROGRAM: ?Access Code [email or web address] ?  ?  ?  ?GOALS: ?Goals reviewed with patient? Yes ?  ?SHORT TERM GOALS: Target date: 02/20/2022 ?  ?Patient will be able to perform tandem balance for 20 sec with left leg behind to improve balance ?Baseline: 5 sec (01/30/22); 30+ sec bil ?Goal status: goal met ?  ?2.  Pt will demo 20 deg of PROM of L ankle to improve ankle flexibility during gait ?Baseline: 10 deg (01/30/22); 15 deg 02/20/22 ?Goal status: INITIAL ?  ?3.   Pt will be compliant with wearing foot up brace at all times during the day and compliant with night splint to maintain tissue flexibility in left ankle ?Baseline: education provided (01/30/22); 100% compliance 02/20/22 ?Goal status: goal met  ?  ?LONG TERM GOALS: Target date: 03/13/2022 ?  ?  Pt will be able to ambulate for 30 min (3x/day) without any significant ankle pain. ?Baseline: 4/10 with 5 min of standing/walking pain in ankle (01/30/22) ?Goal status: INITIAL ?  ?2.  Pt will demo 2+/5 strength in Left ankle dorsiflexion to improve active dorsiflexion during swing phase of gait to improve safety and reduce fall ?Baseline: 0/5 (01/30/22) ?Goal status: INITIAL ?  ? ? ? ?ASSESSMENT: ?  ?CLINICAL IMPRESSION: Pt demonstrates decreased L glut strength with abduction and extension. Added HEP to target glut weakness and core weakness today. Met all short term goals today ? ?  ?  ?OBJECTIVE IMPAIRMENTS Abnormal gait, decreased activity tolerance, decreased balance, decreased endurance, decreased mobility, decreased ROM, decreased strength, hypomobility, increased fascial restrictions, increased muscle spasms, impaired flexibility, impaired sensation, impaired tone, and pain.  ?  ?ACTIVITY LIMITATIONS cleaning, driving, meal prep, laundry, and yard work.  ?  ?PERSONAL FACTORS Time since onset of injury/illness/exacerbation are also affecting patient's functional outcome.  ?  ?  ?REHAB POTENTIAL: Good ?  ?CLINICAL DECISION MAKING: Stable/uncomplicated ?  ?EVALUATION COMPLEXITY: Low ?  ?PLAN: ?PT FREQUENCY: 2x/week ?  ?PT DURATION: 6 weeks ?  ?PLANNED INTERVENTIONS: Therapeutic exercises, Therapeutic activity, Neuromuscular re-education, Balance training, Gait training, Patient/Family education, Joint manipulation, Joint mobilization, Stair training, Orthotic/Fit training, Electrical stimulation, Cryotherapy, Moist heat, Ultrasound, and Manual therapy ?  ?PLAN FOR NEXT SESSION: Review any concerns with newly added HEP, continue  to work on glut strengthening as tolerated, continue to use Bioness E-stim as tolerated by patient. ? ? ? ? ? ?Kerrie Pleasure, PT ?02/20/2022, 9:38 AM ? ?   ?

## 2022-02-24 ENCOUNTER — Ambulatory Visit: Payer: Medicaid Other | Admitting: Physical Therapy

## 2022-02-24 ENCOUNTER — Encounter: Payer: Self-pay | Admitting: Physical Therapy

## 2022-02-24 DIAGNOSIS — R2689 Other abnormalities of gait and mobility: Secondary | ICD-10-CM

## 2022-02-24 DIAGNOSIS — M6281 Muscle weakness (generalized): Secondary | ICD-10-CM

## 2022-02-24 DIAGNOSIS — M21372 Foot drop, left foot: Secondary | ICD-10-CM

## 2022-02-24 NOTE — Therapy (Signed)
?OUTPATIENT PHYSICAL THERAPY TREATMENT NOTE ? ? ?Patient Name: Crystal Boyd ?MRN: [id number] ?DOB:[birth year], 41 y.o., female ?Today's Date: 02/24/2022 ? ?PCP: Pcp, No ?REFERRING PROVIDER: Melvenia Beam, MD ? ? PT End of Session - 02/24/22 8185   ? ? Visit Number 6   ? Number of Visits 13   ? Date for PT Re-Evaluation 03/13/22   ? Authorization Type [employer] Medicaid (27 v max) no autho required >21 yrs old   ? PT Start Time [phone]   ? PT Stop Time 0930   ? PT Time Calculation (min) 40 min   ? Equipment Utilized During Treatment Other (comment)   Bioness  ? Activity Tolerance Patient tolerated treatment well   ? Behavior During Therapy [facility] for tasks assessed/performed   ? ?  ?  ? ?  ? ? ? ?Past Medical History:  ?Diagnosis Date  ? Anemia   ? Gestational diabetes   ? Gestational diabetes   ? Heart murmur   ? ?Past Surgical History:  ?Procedure Laterality Date  ? HYSTEROSCOPY  2014  ? thinks had few months after miscarriage  ? POLYPECTOMY  2020  ? removed polyp in uterus-[facility]  ? TUBAL LIGATION N/A 12/31/2021  ? Procedure: POST PARTUM TUBAL LIGATION;  Surgeon: Gwynne Edinger, MD;  Location: [facility] LD ORS;  Service: Gynecology;  Laterality: N/A;  ? ?Patient Active Problem List  ? Diagnosis Date Noted  ? Left foot drop 01/02/2022  ? History of gestational diabetes 10/24/2021  ? Alpha thalassemia silent carrier 07/18/2021  ? Heart murmur   ? ? ?REFERRING DIAG: M21.372 (ICD-10-CM) - Left foot drop  ? ?THERAPY DIAG:  ?Foot drop, left ? ?Muscle weakness (generalized) ? ?Other abnormalities of gait and mobility ? ?PERTINENT HISTORY: None  ? ?PRECAUTIONS: none  ? ?SUBJECTIVE: No new complaints. No issues with new additions to HEP. Can feel some muscle activation on front of left leg (anterior tib) when trying to lift foot, even if foot does not lift up.  ? ?PAIN:  ?Are you having pain? Yes ?NPRS scale: 2/10 ?Pain location: left lateral/posterior calf ?Pain orientation: Left and Lower  ?PAIN TYPE: Acute, Neuropathic ?Pain  description: intermittent, burning, tingling, and numbness  decreased in intensity from last week ?Aggravating factors: unknown ?Relieving factors: unknown ? ? ?TODAY'S TREATMENT:  ? ? ?02/24/2022 ? [facility] Adult PT Treatment/Exercise   ? ?  ? Electrical Stimulation  ? Electrical Stimulation Location left anterior tibialis   ? Electrical Stimulation Action for increased muslce/nerve activation in both open and closed chain   ? Electrical Stimulation Parameters refer to tablet 1 for adjusted parameters; quick fit electrodes   ? Electrical Stimulation Goals Strength;Neuromuscular facilitation   ? ?  ?  ? ? ?GAIT: ?Gait pattern: step through pattern, decreased stance time- Left, decreased stride length, and decreased ankle dorsiflexion- Left ?Distance walked:  230 x 2 reps, plus around clinic with session ?Assistive device utilized:  Bioness to left anterior tib ?Level of assistance:  supervision ?Comments: gait in session concurrent with Bioness to left anterior tib with good DF response noted.  Cues for increased step length to allow Bioness to fully react to tilt with toe off to swing phase   ? ?NMR- CONCURRENT WITH BIONESS ?Seated at edge of mat with foot on fitter board:>progressing to on balance board for increased DF- assisted DF with on time of 5 seconds, rest for 7 seconds x 6 minutes. ? ?With wooden balance beam next to counter top: with single UE  support tandem walking forward/backward with heel tap to floor with on time of 5 secones, rest in tandem on beam for 7 seconds. for 3 laps each forward/backward. Min guard assist.  ? ?Standing on balance board in anterior/posterior direction- DF with on times of 5 seconds hold times, rest for 7 seconds for 3-4 minutes with single light UE support. Then mini squats with on time, rest with off times for 15 reps with light bil UE support. Min guard assist for safety.  ? ? ?  ?  ?PATIENT EDUCATION: ?Education details: continue with advanced HEP ?Person educated:  Patient ?Education method: Explanation  ?Education comprehension: verbalized understanding ?  ?  ?HOME EXERCISE PROGRAM: ?Access Code [email or web address] ?  ?  ?  ?GOALS: ?Goals reviewed with patient? Yes ?  ?SHORT TERM GOALS: Target date: 02/20/2022 ?  ?Patient will be able to perform tandem balance for 20 sec with left leg behind to improve balance ?Baseline: 02/20/22- 30+ sec bil ?Goal status: goal met ?  ?2.  Pt will demo 20 deg of PROM of L ankle to improve ankle flexibility during gait ?Baseline: 02/20/22- 15 degrees, improved from 10 degrees ?Goal status: Partially met ?  ?3.  Pt will be compliant with wearing foot up brace at all times during the day and compliant with night splint to maintain tissue flexibility in left ankle ?Baseline: 100% compliance 02/20/22 ?Goal status: goal met  ?  ?LONG TERM GOALS: Target date: 03/13/2022 ?  ?Pt will be able to ambulate for 30 min (3x/day) without any significant ankle pain. ?Baseline: 4/10 with 5 min of standing/walking pain in ankle (01/30/22) ?Goal status: INITIAL ?  ?2.  Pt will demo 2+/5 strength in Left ankle dorsiflexion to improve active dorsiflexion during swing phase of gait to improve safety and reduce fall ?Baseline: 0/5 (01/30/22) ?Goal status: INITIAL ?  ? ? ? ?ASSESSMENT: ?  ?CLINICAL IMPRESSION:  ?Skilled session continued to focus on use of Bioness to anterior tib with gait and balance/strengthening ex's with no issues noted or reported in session. The pt is making steady progress toward goals and should benefit from continued PT to progress toward unmet goals.  ?  ?  ?OBJECTIVE IMPAIRMENTS Abnormal gait, decreased activity tolerance, decreased balance, decreased endurance, decreased mobility, decreased ROM, decreased strength, hypomobility, increased fascial restrictions, increased muscle spasms, impaired flexibility, impaired sensation, impaired tone, and pain.  ?  ?ACTIVITY LIMITATIONS cleaning, driving, meal prep, laundry, and yard work.  ?  ?PERSONAL FACTORS Time  since onset of injury/illness/exacerbation are also affecting patient's functional outcome.  ?  ?  ?REHAB POTENTIAL: Good ?  ?CLINICAL DECISION MAKING: Stable/uncomplicated ?  ?EVALUATION COMPLEXITY: Low ?  ?PLAN: ?PT FREQUENCY: 2x/week ?  ?PT DURATION: 6 weeks ?  ?PLANNED INTERVENTIONS: Therapeutic exercises, Therapeutic activity, Neuromuscular re-education, Balance training, Gait training, Patient/Family education, Joint manipulation, Joint mobilization, Stair training, Orthotic/Fit training, Electrical stimulation, Cryotherapy, Moist heat, Ultrasound, and Manual therapy ?  ?PLAN FOR NEXT SESSION:  continue to work on glut/left LE strengthening. continue to use Bioness E-stim as tolerated by patient. Try gait on treadmill with Bioness- inclines/declines, backward and side stepping.  ? ? ? ? ?Willow Ora, PTA, CLT ?[facility] ?[address], [address] ?[place], [place] [address] ?[phone] ?02/24/22, 4:23 PM  ? ?   ?

## 2022-02-26 ENCOUNTER — Ambulatory Visit: Payer: Medicaid Other | Admitting: Physical Therapy

## 2022-02-26 ENCOUNTER — Encounter: Payer: Self-pay | Admitting: Physical Therapy

## 2022-02-26 DIAGNOSIS — R2689 Other abnormalities of gait and mobility: Secondary | ICD-10-CM | POA: Diagnosis not present

## 2022-02-26 DIAGNOSIS — M6281 Muscle weakness (generalized): Secondary | ICD-10-CM

## 2022-02-26 DIAGNOSIS — M21372 Foot drop, left foot: Secondary | ICD-10-CM

## 2022-02-26 NOTE — Therapy (Signed)
?OUTPATIENT PHYSICAL THERAPY TREATMENT NOTE ? ? ?Patient Name: Crystal Boyd ?MRN: [id number] ?DOB:1981-06-05, 41 y.o., female ?Today's Date: 02/26/2022 ? ?PCP: Pcp, No ?REFERRING PROVIDER: Melvenia Beam, MD ? ? PT End of Session - 02/26/22 0849   ? ? Visit Number 7   ? Number of Visits 13   ? Date for PT Re-Evaluation 03/13/22   ? Authorization Type [employer] Medicaid (27 v max) no autho required >21 yrs old   ? PT Start Time [phone]   ? PT Stop Time 0930   ? PT Time Calculation (min) 43 min   ? Equipment Utilized During Treatment Other (comment)   Bioness  ? Activity Tolerance Patient tolerated treatment well   ? Behavior During Therapy [facility] for tasks assessed/performed   ? ?  ?  ? ?  ? ? ? ?Past Medical History:  ?Diagnosis Date  ? Anemia   ? Gestational diabetes   ? Gestational diabetes   ? Heart murmur   ? ?Past Surgical History:  ?Procedure Laterality Date  ? HYSTEROSCOPY  2014  ? thinks had few months after miscarriage  ? POLYPECTOMY  2020  ? removed polyp in uterus-[facility]  ? TUBAL LIGATION N/A 12/31/2021  ? Procedure: POST PARTUM TUBAL LIGATION;  Surgeon: Gwynne Edinger, MD;  Location: [facility] LD ORS;  Service: Gynecology;  Laterality: N/A;  ? ?Patient Active Problem List  ? Diagnosis Date Noted  ? Left foot drop 01/02/2022  ? History of gestational diabetes 10/24/2021  ? Alpha thalassemia silent carrier 07/18/2021  ? Heart murmur   ? ? ?REFERRING DIAG: M21.372 (ICD-10-CM) - Left foot drop  ? ?THERAPY DIAG:  ?Foot drop, left ? ?Muscle weakness (generalized) ? ?Other abnormalities of gait and mobility ? ?PERTINENT HISTORY: None  ? ?PRECAUTIONS: none  ? ?SUBJECTIVE: No new complaints. Did feel some pain in the toe next to the big toe after last session, did not last long. Also feels she can lift her foot up even more than last session.   ? ?PAIN:  ?Are you having pain? Yes ?NPRS scale: 1/10 ?Pain location: left lateral/posterior calf ?Pain orientation: Left and Lower  ?PAIN TYPE: Acute, Neuropathic ?Pain  description: intermittent, burning, tingling, and numbness  decreased in intensity from last week ?Aggravating factors: unknown ?Relieving factors: unknown ? ? ?TODAY'S TREATMENT:  ? ? ?02/24/2022 ? [place] [facility] PT Treatment/Exercise   ? ?  ? Electrical Stimulation  ? Electrical Stimulation Location left anterior tibialis   ? Electrical Stimulation Action for increased muslce/nerve activation in both open and closed chain   ? Electrical Stimulation Parameters refer to tablet 1 for adjusted parameters; quick fit electrodes   ? Electrical Stimulation Goals Strength;Neuromuscular facilitation   ? ?  ?  ? ? ?GAIT: ?Gait pattern: step through pattern, decreased stance time- Left, decreased stride length, and decreased ankle dorsiflexion- Left ?Distance walked:  on treadmill, plus around clinic ?Assistive device utilized:  Bioness to left anterior tib ?Level of assistance:  supervision ?Comments: gait in session concurrent with Bioness to left anterior tib with good DF response noted.  Cues for increased step length to allow Bioness to fully react to tilt with toe off to swing phase  ?Treadmill: forward gait with bil UE's level x 2 minutes, 1% incline x 1 minute, 2% incline x 1 minutes, 1% x 1 minute, then flat x 1 minute, all at 1.4 mph. Min guard assist for safety. Then with treadmill in reverse and pt facing backwards on treadmill to work on  downhill's- 1 minute level, 1% decline x 1 minute, 2% decline x 1 minute ,1% decline x 1 minute, then level x 1 minute, all at 1.4 mph with bil UE support, min guard for safety. Once episode of toe scuffing with 1st 1% decline, pt self correcting.  ? ?NMR- CONCURRENT WITH BIONESS ?On Elliptical with Bioness in cycle mode- level 1.0 x 2 minutes forward with bil UE support. Then 2 minutes backwards with bil UE support.  ? ?Standing on balance board in anterior/posterior direction- DF with on times of 5 seconds hold times, rest for 7 seconds for 3-4 minutes with single light UE support.  Then mini squats with on time, rest with off times for 15 reps with light bil UE support. Min guard assist for safety.  ? ? ?  ?  ?PATIENT EDUCATION: ?Education details: continue with advanced HEP ?Person educated: Patient ?Education method: Explanation  ?Education comprehension: verbalized understanding ?  ?  ?HOME EXERCISE PROGRAM: ?Access Code [email or web address] ?  ?  ?  ?GOALS: ?Goals reviewed with patient? Yes ?  ?SHORT TERM GOALS: Target date: 02/20/2022 ?  ?Patient will be able to perform tandem balance for 20 sec with left leg behind to improve balance ?Baseline: 02/20/22- 30+ sec bil ?Goal status: goal met ?  ?2.  Pt will demo 20 deg of PROM of L ankle to improve ankle flexibility during gait ?Baseline: 02/20/22- 15 degrees, improved from 10 degrees ?Goal status: Partially met ?  ?3.  Pt will be compliant with wearing foot up brace at all times during the day and compliant with night splint to maintain tissue flexibility in left ankle ?Baseline: 100% compliance 02/20/22 ?Goal status: goal met  ?  ?LONG TERM GOALS: Target date: 03/13/2022 ?  ?Pt will be able to ambulate for 30 min (3x/day) without any significant ankle pain. ?Baseline: 4/10 with 5 min of standing/walking pain in ankle (01/30/22) ?Goal status: INITIAL ?  ?2.  Pt will demo 2+/5 strength in Left ankle dorsiflexion to improve active dorsiflexion during swing phase of gait to improve safety and reduce fall ?Baseline: 0/5 (01/30/22) ?Goal status: INITIAL ?  ? ? ? ?ASSESSMENT: ?  ?CLINICAL IMPRESSION:  ?Skilled session continued to focus on use of Bioness to anterior tib with gait and balance/strengthening ex's. Mild dizziness reported with treadmill use that resolved once treadmill stopped, no too severe as pt was able to continue to participate with treadmill activities. Pt did state she liked the use of the Elliptical today. The pt is making progress and should benefit from continued PT to progress toward unmet goals.  ?  ?  ?OBJECTIVE IMPAIRMENTS Abnormal gait,  decreased activity tolerance, decreased balance, decreased endurance, decreased mobility, decreased ROM, decreased strength, hypomobility, increased fascial restrictions, increased muscle spasms, impaired flexibility, impaired sensation, impaired tone, and pain.  ?  ?ACTIVITY LIMITATIONS cleaning, driving, meal prep, laundry, and yard work.  ?  ?PERSONAL FACTORS Time since onset of injury/illness/exacerbation are also affecting patient's functional outcome.  ?  ?  ?REHAB POTENTIAL: Good ?  ?CLINICAL DECISION MAKING: Stable/uncomplicated ?  ?EVALUATION COMPLEXITY: Low ?  ?PLAN: ?PT FREQUENCY: 2x/week ?  ?PT DURATION: 6 weeks ?  ?PLANNED INTERVENTIONS: Therapeutic exercises, Therapeutic activity, Neuromuscular re-education, Balance training, Gait training, Patient/Family education, Joint manipulation, Joint mobilization, Stair training, Orthotic/Fit training, Electrical stimulation, Cryotherapy, Moist heat, Ultrasound, and Manual therapy ?  ?PLAN FOR NEXT SESSION:  continue to work on glut/left LE strengthening. continue to use Bioness E-stim as tolerated by patient. Continue with Elliptical  use. Treadmill as pt tolerates.  ? ? ? ? ?Willow Ora, PTA, CLT ?[place] [address] ?[place], [place] [address] ?[phone] ?02/26/22, 6:53 PM  ? ?   ?

## 2022-03-03 ENCOUNTER — Ambulatory Visit: Payer: Medicaid Other

## 2022-03-04 ENCOUNTER — Ambulatory Visit: Payer: BLUE CROSS/BLUE SHIELD | Admitting: Diagnostic Neuroimaging

## 2022-03-05 ENCOUNTER — Encounter: Payer: Self-pay | Admitting: Physical Therapy

## 2022-03-05 ENCOUNTER — Ambulatory Visit: Payer: Medicaid Other | Admitting: Physical Therapy

## 2022-03-05 DIAGNOSIS — M21372 Foot drop, left foot: Secondary | ICD-10-CM

## 2022-03-05 DIAGNOSIS — R2689 Other abnormalities of gait and mobility: Secondary | ICD-10-CM

## 2022-03-05 DIAGNOSIS — M6281 Muscle weakness (generalized): Secondary | ICD-10-CM

## 2022-03-05 NOTE — Therapy (Signed)
?OUTPATIENT PHYSICAL THERAPY TREATMENT NOTE ? ? ?Patient Name: Crystal Boyd ?MRN: [id number] ?DOB:11-Jan-1981, 41 y.o., female ?Today's Date: 03/05/2022 ? ?PCP: Pcp, No ?REFERRING PROVIDER: Melvenia Beam, MD ? ? PT End of Session - 03/05/22 0850   ? ? Visit Number 8   ? Number of Visits 13   ? Date for PT Re-Evaluation 03/13/22   ? Authorization Type [employer] Medicaid (27 v max) no autho required >21 yrs old   ? PT Start Time [phone]   ? PT Stop Time [phone]   ? PT Time Calculation (min) 39 min   ? Equipment Utilized During Treatment Other (comment)   Bioness  ? Activity Tolerance Patient tolerated treatment well   ? Behavior During Therapy [facility] for tasks assessed/performed   ? ?  ?  ? ?  ? ? ? ?Past Medical History:  ?Diagnosis Date  ? Anemia   ? Gestational diabetes   ? Gestational diabetes   ? Heart murmur   ? ?Past Surgical History:  ?Procedure Laterality Date  ? HYSTEROSCOPY  2014  ? thinks had few months after miscarriage  ? POLYPECTOMY  2020  ? removed polyp in uterus-[facility]  ? TUBAL LIGATION N/A 12/31/2021  ? Procedure: POST PARTUM TUBAL LIGATION;  Surgeon: Gwynne Edinger, MD;  Location: [facility] LD ORS;  Service: Gynecology;  Laterality: N/A;  ? ?Patient Active Problem List  ? Diagnosis Date Noted  ? Left foot drop 01/02/2022  ? History of gestational diabetes 10/24/2021  ? Alpha thalassemia silent carrier 07/18/2021  ? Heart murmur   ? ? ?REFERRING DIAG: M21.372 (ICD-10-CM) - Left foot drop  ? ?THERAPY DIAG:  ?Foot drop, left ? ?Muscle weakness (generalized) ? ?Other abnormalities of gait and mobility ? ?PERTINENT HISTORY: None  ? ?PRECAUTIONS: none  ? ?SUBJECTIVE: No new complaints. Feels she can move her foot up even more now and has some movement of her big toe now.  ?PAIN:  ?Are you having pain? Yes ?NPRS scale: 2/10 ?Pain location: left knee ?Pain orientation: Left and Lower  ?PAIN TYPE: Acute, Neuropathic ?Pain description: dull ache ?Aggravating factors: immobility for prolonged times, increased weight  bearing over a period of time ?Relieving factors: Ibuprofen ? ? ?TODAY'S TREATMENT:  ? ?03/05/2022 ? [place] Adult PT Treatment/Exercise   ? ?  ? Electrical Stimulation  ? Electrical Stimulation Location left anterior tibialis   ? Electrical Stimulation Action for increased muslce/nerve activation in both open and closed chain   ? Electrical Stimulation Parameters refer to tablet 1 for adjusted parameters; quick fit electrodes   ? Electrical Stimulation Goals Strength;Neuromuscular facilitation   ? ?  ?  ? ? ?GAIT: ?Gait pattern: step through pattern, decreased stance time- Left, decreased stride length, and decreased ankle dorsiflexion- Left ?Distance walked: 450 x 1 , plus around clinic ?Assistive device utilized:  Bioness to left anterior tib ?Level of assistance:  supervision ?Comments: gait in session concurrent with Bioness to left anterior tib with good DF response noted.  Cues for increased step length to allow Bioness to fully react to tilt with toe off to swing phase  ? ? ?NMR- CONCURRENT WITH BIONESS ?On Elliptical with Bioness in cycle mode- level 1.0 x 3 minutes forward with bil UE support. Then 3 minutes backwards with bil UE support.  ? ?Side Stepping: on blue foam beam for 4 laps toward each side with Bioness in gait mode, light to no UE support on bars. Cues on posture and step length.  ?Tandem Walking: on  blue foam beam for 4 laps each forward/backward with heel taps to floor with each step, Bioness in training mode with 4 sec on time, 4 sec off time. Had pt performing steps with on time, resting both feet on beam in tandem with off times. Min guard assist for safety with light support on bars.   ? ?  ?  ?PATIENT EDUCATION: ?Education details: continue with current HEP ?Person educated: Patient ?Education method: Explanation  ?Education comprehension: verbalized understanding ?  ?  ?HOME EXERCISE PROGRAM: ?Access Code [email or web address] ?  ?  ?  ?GOALS: ?Goals reviewed with patient? Yes ?  ?SHORT TERM GOALS:  Target date: 02/20/2022 ?  ?Patient will be able to perform tandem balance for 20 sec with left leg behind to improve balance ?Baseline: 02/20/22- 30+ sec bil ?Goal status: goal met ?  ?2.  Pt will demo 20 deg of PROM of L ankle to improve ankle flexibility during gait ?Baseline: 02/20/22- 15 degrees, improved from 10 degrees ?Goal status: Partially met ?  ?3.  Pt will be compliant with wearing foot up brace at all times during the day and compliant with night splint to maintain tissue flexibility in left ankle ?Baseline: 100% compliance 02/20/22 ?Goal status: goal met  ?  ?LONG TERM GOALS: Target date: 03/13/2022 ?  ?Pt will be able to ambulate for 30 min (3x/day) without any significant ankle pain. ?Baseline: 4/10 with 5 min of standing/walking pain in ankle (01/30/22) ?Goal status: INITIAL ?  ?2.  Pt will demo 2+/5 strength in Left ankle dorsiflexion to improve active dorsiflexion during swing phase of gait to improve safety and reduce fall ?Baseline: 0/5 (01/30/22) ?Goal status: INITIAL ?  ? ? ? ?ASSESSMENT: ?  ?CLINICAL IMPRESSION:  ?Skilled session continued to focus on use of Bioness to anterior tib with gait and balance/strengthening ex's. No issues noted or reported in session.  ?  ?  ?OBJECTIVE IMPAIRMENTS Abnormal gait, decreased activity tolerance, decreased balance, decreased endurance, decreased mobility, decreased ROM, decreased strength, hypomobility, increased fascial restrictions, increased muscle spasms, impaired flexibility, impaired sensation, impaired tone, and pain.  ?  ?ACTIVITY LIMITATIONS cleaning, driving, meal prep, laundry, and yard work.  ?  ?PERSONAL FACTORS Time since onset of injury/illness/exacerbation are also affecting patient's functional outcome.  ?  ?  ?REHAB POTENTIAL: Good ?  ?CLINICAL DECISION MAKING: Stable/uncomplicated ?  ?EVALUATION COMPLEXITY: Low ?  ?PLAN: ?PT FREQUENCY: 2x/week ?  ?PT DURATION: 6 weeks ?  ?PLANNED INTERVENTIONS: Therapeutic exercises, Therapeutic activity,  Neuromuscular re-education, Balance training, Gait training, Patient/Family education, Joint manipulation, Joint mobilization, Stair training, Orthotic/Fit training, Electrical stimulation, Cryotherapy, Moist heat, Ultrasound, and Manual therapy ?  ?PLAN FOR NEXT SESSION:  begin to check goals for recert vs discharge  ? ? ? ? ?Willow Ora, PTA, CLT ?[place] ?[address], [address] ?[place], [place] [address] ?[phone] ?03/05/22, 4:05 PM  ? ?   ?

## 2022-03-11 ENCOUNTER — Ambulatory Visit: Payer: Medicaid Other | Attending: Neurology

## 2022-03-11 DIAGNOSIS — R2689 Other abnormalities of gait and mobility: Secondary | ICD-10-CM

## 2022-03-11 DIAGNOSIS — M21372 Foot drop, left foot: Secondary | ICD-10-CM | POA: Diagnosis present

## 2022-03-11 DIAGNOSIS — M6281 Muscle weakness (generalized): Secondary | ICD-10-CM | POA: Diagnosis present

## 2022-03-11 NOTE — Therapy (Addendum)
OUTPATIENT PHYSICAL THERAPY RECERTIFICATION NOTE  Patient Name: Crystal Boyd MRN: [id number] DOB:Feb 21, 1981, 41 y.o., female Today's Date: 03/11/2022  PCP: Merryl Hacker, No REFERRING PROVIDER: Melvenia Beam, MD   PT End of Session - 03/11/22 [phone]     Visit Number 9    Number of Visits 17    Date for PT Re-Evaluation 05/20/22    Authorization Type [employer] Medicaid (27 v max) no autho required >27 yrs old; recert on 0/3/50 (1x week for 8 sessions)    PT Start Time 0850    PT Stop Time 0930    PT Time Calculation (min) 40 min    Equipment Utilized During Treatment Other (comment)   Bioness   Activity Tolerance Patient tolerated treatment well    Behavior During Therapy [facility] for tasks assessed/performed               Past Medical History:  Diagnosis Date   Anemia    Gestational diabetes    Gestational diabetes    Heart murmur    Past Surgical History:  Procedure Laterality Date   HYSTEROSCOPY  2014   thinks had few months after miscarriage   POLYPECTOMY  2020   removed polyp in uterus-[facility]   TUBAL LIGATION N/A 12/31/2021   Procedure: POST PARTUM TUBAL LIGATION;  Surgeon: Gwynne Edinger, MD;  Location: [facility] LD ORS;  Service: Gynecology;  Laterality: N/A;   Patient Active Problem List   Diagnosis Date Noted   Left foot drop 01/02/2022   History of gestational diabetes 10/24/2021   Alpha thalassemia silent carrier 07/18/2021   Heart murmur     REFERRING DIAG: M21.372 (ICD-10-CM) - Left foot drop   THERAPY DIAG:  Foot drop, left  Muscle weakness (generalized)  Other abnormalities of gait and mobility  PERTINENT HISTORY: None   PRECAUTIONS: none   SUBJECTIVE: I can move my toe and foot better. I can actually lift my foot up.  PAIN:  Are you having pain? Yes NPRS scale: 2/10 Pain location:Right  knee; left knee Pain orientation: Left and Lower  PAIN TYPE: Acute, Neuropathic Pain description: dull ache Aggravating factors: immobility for prolonged  times, increased weight bearing over a period of time Relieving factors: Ibuprofen   OBJECTIVE:  LE ROM:      Passive /Active Right 01/30/2022 Left 01/30/2022 Left 03/11/22  Hip flexion       Hip extension       Hip abduction       Hip adduction       Hip internal rotation       Hip external rotation       Knee flexion       Knee extension       Ankle dorsiflexion 20 10 20/-15  Ankle plantarflexion WNL WNL   Ankle inversion WNL WNL   Ankle eversion WNL WNL    (Blank rows = not tested)   MMT:     MMT Right 01/30/2022 Left 01/30/2022 Left 03/11/22  Hip flexion       Hip extension       Hip abduction       Hip adduction       Hip internal rotation       Hip external rotation       Knee flexion       Knee extension       Ankle dorsiflexion 5/5 0/5 2/5  Ankle plantarflexion 5/5 4/5 4/5  Ankle inversion 5/5 1/5 3+/5  Ankle eversion 5/5 3+/5  4/5  (Blank rows = not tested)    TODAY'S TREATMENT:   03/11/22: Reassessment performed today for ROM and MMT (see above) Standing gastroc and soleus stretch with 2 x 4 under ball of foot: 3 x 30" L only, each with knee straight and bent Wall plank with elbows on wall: pt flexes one hip/knee to touch the knee to wall in front also using contralateral ankle plantarflexion to get the knee to touch: 10x R and L Sit to stand: 10 lbs  Tread mill walking: safety cord on Backward walking: 0.7 mph 6' Lateral walking: 0.4 mph 3' each side Fwd walking, 5% grade, 0.8 mph for 6'      03/05/2022  [facility] PT Treatment/Exercise       Acupuncturist Stimulation Location left anterior tibialis    Electrical Stimulation Action for increased muslce/nerve activation in both open and closed chain    Electrical Stimulation Parameters refer to tablet 1 for adjusted parameters; quick fit electrodes    Electrical Stimulation Goals Strength;Neuromuscular facilitation          GAIT: Gait pattern: step through pattern,  decreased stance time- Left, decreased stride length, and decreased ankle dorsiflexion- Left Distance walked: 450 x 1 , plus around clinic Assistive device utilized:  Bioness to left anterior tib Level of assistance:  supervision Comments: gait in session concurrent with Bioness to left anterior tib with good DF response noted.  Cues for increased step length to allow Bioness to fully react to tilt with toe off to swing phase    NMR- CONCURRENT WITH BIONESS On Elliptical with Bioness in cycle mode- level 1.0 x 3 minutes forward with bil UE support. Then 3 minutes backwards with bil UE support.   Side Stepping: on blue foam beam for 4 laps toward each side with Bioness in gait mode, light to no UE support on bars. Cues on posture and step length.  Tandem Walking: on blue foam beam for 4 laps each forward/backward with heel taps to floor with each step, Bioness in training mode with 4 sec on time, 4 sec off time. Had pt performing steps with on time, resting both feet on beam in tandem with off times. Min guard assist for safety with light support on bars.        PATIENT EDUCATION: Education details: continue with current HEP Person educated: Patient Education method: Explanation  Education comprehension: verbalized understanding     HOME EXERCISE PROGRAM: Access Code [email or web address] Wall plank with elbows on wall: pt flexes one hip/knee to touch the knee to wall in front also using contralateral ankle plantarflexion to get the knee to touch: 10x R and L (added 03/11/22)       GOALS: Goals reviewed with patient? Yes   SHORT TERM GOALS: Target date: 02/20/2022   Patient will be able to perform tandem balance for 20 sec with left leg behind to improve balance Baseline: 02/20/22- 30+ sec bil Goal status: goal met   2.  Pt will demo 20 deg of PROM of L ankle to improve ankle flexibility during gait Baseline: 02/20/22- 15 degrees, improved from 10 degrees, 20 deg passive ankle DF (03/11/22) Goal  status: MET   3.  Pt will be compliant with wearing foot up brace at all times during the day and compliant with night splint to maintain tissue flexibility in left ankle Baseline: 100% compliance 02/20/22 Goal status: goal met    LONG TERM GOALS: Target date: 05/20/22 Pt will be  able to ambulate for 30 min (3x/day) without any significant ankle pain. Baseline: 4/10 with 5 min of standing/walking pain in ankle (01/30/22); 20 min with 3/10 pain in ankle (03/11/22) Goal status: progressing   2.  Pt will demo 2+/5 strength in Left ankle dorsiflexion to improve active dorsiflexion during swing phase of gait to improve safety and reduce fall Baseline: 0/5 (01/30/22) Goal status: goal met   3.  Pt will demo 3+/5 strength in Left ankle dorsiflexion to improve active dorsiflexion during swing phase of gait to improve safety and reduce fall Baseline: 0/5 (01/30/22); 2+/5 (03/11/22) Goal status: Revised on 03/11/22, progressing  4.  Pt will demo 0 deg of L ankle DF AROM to improve ankle dorsiflexion during gait and prevent falls. Baseline: -15 deg L ankle DF AROM (03/11/22) Goal status:Revised on 03/11/22, progressing    ASSESSMENT:   CLINICAL IMPRESSION:  Pt has been seen for total of 9 sessions for L foot drop. Patient is demonstrating improving AROM and PROM in left foot and improving strength in L ankle musculature. Pt has met all of her short term goals .Patient is making steady progress towards her long term goals and will benefit from continued PT to improve ankle ROM and strength to WNL.     OBJECTIVE IMPAIRMENTS Abnormal gait, decreased activity tolerance, decreased balance, decreased endurance, decreased mobility, decreased ROM, decreased strength, hypomobility, increased fascial restrictions, increased muscle spasms, impaired flexibility, impaired sensation, impaired tone, and pain.    ACTIVITY LIMITATIONS cleaning, driving, meal prep, laundry, and yard work.    PERSONAL FACTORS Time since onset  of injury/illness/exacerbation are also affecting patient's functional outcome.      REHAB POTENTIAL: Good   CLINICAL DECISION MAKING: Stable/uncomplicated   EVALUATION COMPLEXITY: Low   PLAN: PT FREQUENCY: 1x/week   PT DURATION: 8 sessions (over 10 weeks)   PLANNED INTERVENTIONS: Therapeutic exercises, Therapeutic activity, Neuromuscular re-education, Balance training, Gait training, Patient/Family education, Joint manipulation, Joint mobilization, Stair training, Orthotic/Fit training, Electrical stimulation, Cryotherapy, Moist heat, Ultrasound, and Manual therapy   PLAN FOR NEXT SESSION:  Continue with bioness, gastroc/soleus stretching, L hip/knee strengthening     Kerrie Pleasure, PT 03/11/2022, 9:33 AM

## 2022-03-13 ENCOUNTER — Ambulatory Visit: Payer: Medicaid Other

## 2022-03-17 ENCOUNTER — Ambulatory Visit: Payer: Medicaid Other | Admitting: Physical Therapy

## 2022-03-27 ENCOUNTER — Ambulatory Visit: Payer: Medicaid Other

## 2022-03-27 ENCOUNTER — Ambulatory Visit: Payer: Medicaid Other | Admitting: Physical Therapy

## 2022-03-27 ENCOUNTER — Encounter: Payer: Self-pay | Admitting: Physical Therapy

## 2022-03-27 DIAGNOSIS — R2689 Other abnormalities of gait and mobility: Secondary | ICD-10-CM

## 2022-03-27 DIAGNOSIS — M6281 Muscle weakness (generalized): Secondary | ICD-10-CM

## 2022-03-27 DIAGNOSIS — M21372 Foot drop, left foot: Secondary | ICD-10-CM | POA: Diagnosis not present

## 2022-03-27 NOTE — Therapy (Signed)
OUTPATIENT PHYSICAL THERAPY TREATMENT NOTE  Patient Name: Crystal Boyd MRN: [id number] DOB:07/16/1981, 41 y.o., female Today's Date: 03/27/2022  PCP: Merryl Hacker, No REFERRING PROVIDER: Melvenia Beam, MD   PT End of Session - 03/27/22 0934     Visit Number 10    Number of Visits 17    Date for PT Re-Evaluation 05/20/22    Authorization Type [employer] Medicaid (27 v max) no autho required >46 yrs old; recert on 03/14/80 (1x week for 8 sessions)    PT Start Time 0932    PT Stop Time 1012    PT Time Calculation (min) 40 min    Equipment Utilized During Treatment Other (comment)   Bioness   Activity Tolerance Patient tolerated treatment well    Behavior During Therapy WFL for tasks assessed/performed               Past Medical History:  Diagnosis Date   Anemia    Gestational diabetes    Gestational diabetes    Heart murmur    Past Surgical History:  Procedure Laterality Date   HYSTEROSCOPY  2014   thinks had few months after miscarriage   POLYPECTOMY  2020   removed polyp in uterus-[facility]   TUBAL LIGATION N/A 12/31/2021   Procedure: POST PARTUM TUBAL LIGATION;  Surgeon: Gwynne Edinger, MD;  Location: [facility];  Service: Gynecology;  Laterality: N/A;   Patient Active Problem List   Diagnosis Date Noted   Left foot drop 01/02/2022   History of gestational diabetes 10/24/2021   Alpha thalassemia silent carrier 07/18/2021   Heart murmur     REFERRING DIAG: M21.372 (ICD-10-CM) - Left foot drop   THERAPY DIAG:  Muscle weakness (generalized)  Other abnormalities of gait and mobility  Foot drop, left  PERTINENT HISTORY: None   PRECAUTIONS: none   SUBJECTIVE: No new complaints. No falls or pain. Not wearing the foot up brace all the time, only when she has multiple errands to run in the community. The numbness in her left leg is almost completely gone.   PAIN:  Are you having pain? No       TODAY'S TREATMENT:  03/27/2022  [facility] Adult PT  Treatment/Exercise       Electrical Stimulation   Electrical Stimulation Location left anterior tibialis    Electrical Stimulation Action for increased muslce/nerve activation in both open and closed chain    Electrical Stimulation Parameters refer to tablet 1 for adjusted parameters; quick fit electrodes    Electrical Stimulation Goals Strength;Neuromuscular facilitation          GAIT: Gait pattern: step through pattern, decreased stance time- Left, decreased stride length, and decreased ankle dorsiflexion- Left Distance walked: 200 x 1 , plus around clinic Assistive device utilized:  Bioness to left anterior tib Level of assistance:  supervision Comments: gait in session concurrent with Bioness to left anterior tib with good DF response noted.  Cues for increased step length to allow Bioness to fully react to tilt with toe off to swing phase    NMR- CONCURRENT WITH BIONESS On Elliptical with Bioness in cycle mode- level 1.0 x 3 minutes forward with bil UE support. Then 3 minutes backwards with bil UE support.  At counter- heel walking- tried in both walking and strengthening mode, works best with walking with left foot still unable to maintain DF to keep toes off floor through out the walk for several laps With blue foam beam: tandem gait with heel taps to floor  with on time on left LE, off time with right LE for 4 laps forward with single UE support on counter, min guard assist.  On balance board in anterior/posterior - heel taps alternating to floor with on time on left LE, off time on right LE for 10 reps each side, min guard assist with single UE support - rocking for DF with on time, rest in neutral for off time for 15 reps.        PATIENT EDUCATION: Education details: continue with current HEP Person educated: Patient Education method: Explanation  Education comprehension: verbalized understanding     HOME EXERCISE PROGRAM: Access Code [email or web address] Wall plank with elbows  on wall: pt flexes one hip/knee to touch the knee to wall in front also using contralateral ankle plantarflexion to get the knee to touch: 10x R and L (added 03/11/22)       GOALS: Goals reviewed with patient? Yes   SHORT TERM GOALS: Target date: 02/20/2022   Patient will be able to perform tandem balance for 20 sec with left leg behind to improve balance Baseline: 02/20/22- 30+ sec bil Goal status: goal met   2.  Pt will demo 20 deg of PROM of L ankle to improve ankle flexibility during gait Baseline: 02/20/22- 15 degrees, improved from 10 degrees, 20 deg passive ankle DF (03/11/22) Goal status: MET   3.  Pt will be compliant with wearing foot up brace at all times during the day and compliant with night splint to maintain tissue flexibility in left ankle Baseline: 100% compliance 02/20/22 Goal status: goal met    LONG TERM GOALS: Target date: 05/20/2022   Pt will be able to ambulate for 30 min (3x/day) without any significant ankle pain. Baseline:  20 min with 3/10 pain in ankle (03/11/22) Goal status: progressing   2.  Pt will demo 2+/5 strength in Left ankle dorsiflexion to improve active dorsiflexion during swing phase of gait to improve safety and reduce fall Baseline: 0/5 (01/30/22)- see goal #3 Goal status: goal met   3.  Pt will demo 3+/5 strength in Left ankle dorsiflexion to improve active dorsiflexion during swing phase of gait to improve safety and reduce fall Baseline: 2+/5 (03/11/22) Goal status: Revised on 03/11/22, progressing  4.  Pt will demo 0 deg of L ankle DF AROM to improve ankle dorsiflexion during gait and prevent falls. Baseline: -15 deg L ankle DF AROM (03/11/22) Goal status: Revised on 03/11/22, progressing    ASSESSMENT:   CLINICAL IMPRESSION:  Today's skilled session continued to focus on use of Bioness in left anterior tib concurrent with gait and strengthening/balance ex's with session. No issues noted or reported in session. The pt is making steady progress  toward goals and should benefit from continued PT to progress toward unmet goals.     OBJECTIVE IMPAIRMENTS Abnormal gait, decreased activity tolerance, decreased balance, decreased endurance, decreased mobility, decreased ROM, decreased strength, hypomobility, increased fascial restrictions, increased muscle spasms, impaired flexibility, impaired sensation, impaired tone, and pain.    ACTIVITY LIMITATIONS cleaning, driving, meal prep, laundry, and yard work.    PERSONAL FACTORS Time since onset of injury/illness/exacerbation are also affecting patient's functional outcome.      REHAB POTENTIAL: Good   CLINICAL DECISION MAKING: Stable/uncomplicated   EVALUATION COMPLEXITY: Low   PLAN: PT FREQUENCY: 1x/week   PT DURATION: 8 sessions (over 10 weeks)   PLANNED INTERVENTIONS: Therapeutic exercises, Therapeutic activity, Neuromuscular re-education, Balance training, Gait training, Patient/Family education, Joint manipulation, Joint mobilization, Stair  training, Orthotic/Fit training, Electrical stimulation, Cryotherapy, Moist heat, Ultrasound, and Manual therapy   PLAN FOR NEXT SESSION:  Continue with bioness, gastroc/soleus stretching, L hip/knee strengthening    Willow Ora, PTA, [facility] [address] [place], [place] [address] [phone] 03/27/22, 4:15 PM

## 2022-04-01 ENCOUNTER — Encounter: Payer: Self-pay | Admitting: Physical Therapy

## 2022-04-01 ENCOUNTER — Ambulatory Visit: Payer: Medicaid Other | Admitting: Physical Therapy

## 2022-04-01 DIAGNOSIS — M6281 Muscle weakness (generalized): Secondary | ICD-10-CM

## 2022-04-01 DIAGNOSIS — R2689 Other abnormalities of gait and mobility: Secondary | ICD-10-CM

## 2022-04-01 DIAGNOSIS — M21372 Foot drop, left foot: Secondary | ICD-10-CM

## 2022-04-01 NOTE — Therapy (Signed)
OUTPATIENT PHYSICAL THERAPY TREATMENT NOTE  Patient Name: Crystal Boyd MRN: [id number] DOB:05-19-1981, 41 y.o., female Today's Date: 04/01/2022  PCP: Merryl Hacker, No REFERRING PROVIDER: Melvenia Beam, MD   PT End of Session - 04/01/22 0853     Visit Number 11    Number of Visits 17    Date for PT Re-Evaluation 05/20/22    Authorization Type [employer] Medicaid (27 v max) no autho required >42 yrs old; recert on 01/12/35 (1x week for 8 sessions)    PT Start Time 0850    PT Stop Time 0930    PT Time Calculation (min) 40 min    Equipment Utilized During Treatment Other (comment)   Bioness   Activity Tolerance Patient tolerated treatment well    Behavior During Therapy WFL for tasks assessed/performed               Past Medical History:  Diagnosis Date   Anemia    Gestational diabetes    Gestational diabetes    Heart murmur    Past Surgical History:  Procedure Laterality Date   HYSTEROSCOPY  2014   thinks had few months after miscarriage   POLYPECTOMY  2020   removed polyp in uterus-[facility]   TUBAL LIGATION N/A 12/31/2021   Procedure: POST PARTUM TUBAL LIGATION;  Surgeon: Gwynne Edinger, MD;  Location: [facility];  Service: Gynecology;  Laterality: N/A;   Patient Active Problem List   Diagnosis Date Noted   Left foot drop 01/02/2022   History of gestational diabetes 10/24/2021   Alpha thalassemia silent carrier 07/18/2021   Heart murmur     REFERRING DIAG: M21.372 (ICD-10-CM) - Left foot drop   THERAPY DIAG:  Muscle weakness (generalized)  Other abnormalities of gait and mobility  Foot drop, left  PERTINENT HISTORY: None   PRECAUTIONS: none   SUBJECTIVE: No new complaints. No falls or pain.Only having light numbness in areas of big toe and parts of lower leg.   PAIN:  Are you having pain? No       TODAY'S TREATMENT:  04/01/2022  [facility] Adult PT Treatment/Exercise       Electrical Stimulation   Electrical Stimulation Location left anterior  tibialis    Electrical Stimulation Action for increased muslce/nerve activation in both open and closed chain    Electrical Stimulation Parameters refer to tablet 1 for adjusted parameters; quick fit electrodes    Electrical Stimulation Goals Strength;Neuromuscular facilitation          GAIT: Gait pattern: step through pattern, decreased stance time- Left, decreased stride length, and decreased ankle dorsiflexion- Left Distance walked: 350 x 1 , plus around clinic Assistive device utilized:  Bioness to left anterior tib Level of assistance:  supervision Comments: gait in session concurrent with Bioness to left anterior tib with good DF response noted.  Cues for increased step length to allow Bioness to fully react to tilt with toe off to swing phase    NMR- CONCURRENT WITH BIONESS On Elliptical with Bioness in cycle mode- level 1.0 x 3 minutes forward with bil UE support. Then 3 minutes backwards with bil UE support.   On inverted BOSU:  - posterior rocking for DF hold/heel cord stretch with on time of 6 seconds, rest in neutral for 5 seconds x 15 reps with bil UE support - mini squats with on time of 6 seconds, rest for 5 seconds x 10 reps with bil UE support on bars  On blue foam beam: walking tandem forward/backward with  left heel tap to floor each time with on time 4 seconds, right heel taps to floor each step with off time of 4 seconds for 2 laps each way with light UE support  Left ankle active DF- neutral while lying down with foot propped on bolster.         PATIENT EDUCATION: Education details: continue with current HEP Person educated: Patient Education method: Explanation  Education comprehension: verbalized understanding     HOME EXERCISE PROGRAM: Access Code [email or web address] Wall plank with elbows on wall: pt flexes one hip/knee to touch the knee to wall in front also using contralateral ankle plantarflexion to get the knee to touch: 10x R and L (added 03/11/22)        GOALS: Goals reviewed with patient? Yes   SHORT TERM GOALS: Target date: 02/20/2022   Patient will be able to perform tandem balance for 20 sec with left leg behind to improve balance Baseline: 02/20/22- 30+ sec bil Goal status: goal met   2.  Pt will demo 20 deg of PROM of L ankle to improve ankle flexibility during gait Baseline: 02/20/22- 15 degrees, improved from 10 degrees, 20 deg passive ankle DF (03/11/22) Goal status: MET   3.  Pt will be compliant with wearing foot up brace at all times during the day and compliant with night splint to maintain tissue flexibility in left ankle Baseline: 100% compliance 02/20/22 Goal status: goal met    LONG TERM GOALS: Target date: 05/20/2022   Pt will be able to ambulate for 30 min (3x/day) without any significant ankle pain. Baseline:  20 min with 3/10 pain in ankle (03/11/22) Goal status: progressing   2.  Pt will demo 2+/5 strength in Left ankle dorsiflexion to improve active dorsiflexion during swing phase of gait to improve safety and reduce fall Baseline: 0/5 (01/30/22)- see goal #3 Goal status: goal met   3.  Pt will demo 3+/5 strength in Left ankle dorsiflexion to improve active dorsiflexion during swing phase of gait to improve safety and reduce fall Baseline: 2+/5 (03/11/22) Goal status: Revised on 03/11/22, progressing  4.  Pt will demo 0 deg of L ankle DF AROM to improve ankle dorsiflexion during gait and prevent falls. Baseline: -15 deg L ankle DF AROM (03/11/22) Goal status: Revised on 03/11/22, progressing    ASSESSMENT:   CLINICAL IMPRESSION:  Today's skilled session continued to focus on use of Bioness in left anterior tib concurrent with gait and strengthening/balance ex's with session. Pt now able to achieve left ankle active ROM to neutral. The pt is progressing toward goals and should benefit from continued PT to progress toward unmet goals.    OBJECTIVE IMPAIRMENTS Abnormal gait, decreased activity tolerance, decreased  balance, decreased endurance, decreased mobility, decreased ROM, decreased strength, hypomobility, increased fascial restrictions, increased muscle spasms, impaired flexibility, impaired sensation, impaired tone, and pain.    ACTIVITY LIMITATIONS cleaning, driving, meal prep, laundry, and yard work.    PERSONAL FACTORS Time since onset of injury/illness/exacerbation are also affecting patient's functional outcome.      REHAB POTENTIAL: Good   CLINICAL DECISION MAKING: Stable/uncomplicated   EVALUATION COMPLEXITY: Low   PLAN: PT FREQUENCY: 1x/week   PT DURATION: 8 sessions (over 10 weeks)   PLANNED INTERVENTIONS: Therapeutic exercises, Therapeutic activity, Neuromuscular re-education, Balance training, Gait training, Patient/Family education, Joint manipulation, Joint mobilization, Stair training, Orthotic/Fit training, Electrical stimulation, Cryotherapy, Moist heat, Ultrasound, and Manual therapy   PLAN FOR NEXT SESSION:  Continue with bioness, gastroc/soleus stretching, L  hip/knee strengthening and anterior tib on left strengthening    Willow Ora, PTA, [facility] [address] [place], [place] [address] [phone] 04/01/22, 10:45 AM

## 2022-04-08 ENCOUNTER — Encounter: Payer: Self-pay | Admitting: Physical Therapy

## 2022-04-08 ENCOUNTER — Ambulatory Visit: Payer: Medicaid Other | Admitting: Physical Therapy

## 2022-04-08 DIAGNOSIS — M21372 Foot drop, left foot: Secondary | ICD-10-CM

## 2022-04-08 DIAGNOSIS — R2689 Other abnormalities of gait and mobility: Secondary | ICD-10-CM

## 2022-04-08 DIAGNOSIS — M6281 Muscle weakness (generalized): Secondary | ICD-10-CM

## 2022-04-08 NOTE — Therapy (Signed)
OUTPATIENT PHYSICAL THERAPY TREATMENT NOTE  Patient Name: Crystal Boyd MRN: [id number] DOB:10-19-1981, 41 y.o., female Today's Date: 04/08/2022  PCP: Merryl Hacker, No REFERRING PROVIDER: Melvenia Beam, MD   PT End of Session - 04/08/22 0853     Visit Number 12    Number of Visits 17    Date for PT Re-Evaluation 05/20/22    Authorization Type [employer] Medicaid (27 v max) no autho required >97 yrs old; recert on 0/2/63 (1x week for 8 sessions)    PT Start Time 0850    PT Stop Time 0930    PT Time Calculation (min) 40 min    Equipment Utilized During Treatment --    Activity Tolerance Patient tolerated treatment well    Behavior During Therapy WFL for tasks assessed/performed               Past Medical History:  Diagnosis Date   Anemia    Gestational diabetes    Gestational diabetes    Heart murmur    Past Surgical History:  Procedure Laterality Date   HYSTEROSCOPY  2014   thinks had few months after miscarriage   POLYPECTOMY  2020   removed polyp in uterus-[facility]   TUBAL LIGATION N/A 12/31/2021   Procedure: POST PARTUM TUBAL LIGATION;  Surgeon: Gwynne Edinger, MD;  Location: [facility];  Service: Gynecology;  Laterality: N/A;   Patient Active Problem List   Diagnosis Date Noted   Left foot drop 01/02/2022   History of gestational diabetes 10/24/2021   Alpha thalassemia silent carrier 07/18/2021   Heart murmur     REFERRING DIAG: M21.372 (ICD-10-CM) - Left foot drop   THERAPY DIAG:  Muscle weakness (generalized)  Other abnormalities of gait and mobility  Foot drop, left  PERTINENT HISTORY: None   PRECAUTIONS: none   SUBJECTIVE: No new complaints. No falls or pain. Feels she can lift the foot up more. Also reports her sensation is improving as well.   PAIN:  Are you having pain? No       TODAY'S TREATMENT:  04/08/2022  STRENGTHENING  Elliptical level 1.0  x 2 minutes each forward/backward with UE support Red band resistance for: assisted  DF/resisted PF, resisted inversion, resisted eversion and resided DF for 15 reps each with assist to keep stability/decrease compensatory movements At bottom step with bil UE support on rails: bil LE's- heel drops of edge/back up for 10 reps, then left LE only with right foot resting on step above for 10 reps. Min guard assist for safety with cues on technique.   BALANCE/NMR: On inverted BOSU: rocking anterior/posterior for 10 reps with emphasis on tall posture, then laterally for 10 reps each way with emphasis on posture,  and ending with mini squats x 10 reps, light to no UE support on bars, min guard assist for balance.  On wooden balance beam: side stepping with heels up, then side stepping with toes up for 3 laps each left<>right with light UE support on counter, min guard assist for balance.         PATIENT EDUCATION: Education details: continue with current HEP Person educated: Patient Education method: Explanation  Education comprehension: verbalized understanding     HOME EXERCISE PROGRAM: Access Code [email or web address] Wall plank with elbows on wall: pt flexes one hip/knee to touch the knee to wall in front also using contralateral ankle plantarflexion to get the knee to touch: 10x R and L (added 03/11/22)       GOALS: Goals reviewed  with patient? Yes   SHORT TERM GOALS: Target date: 02/20/2022   Patient will be able to perform tandem balance for 20 sec with left leg behind to improve balance Baseline: 02/20/22- 30+ sec bil Goal status: goal met   2.  Pt will demo 20 deg of PROM of L ankle to improve ankle flexibility during gait Baseline: 02/20/22- 15 degrees, improved from 10 degrees, 20 deg passive ankle DF (03/11/22) Goal status: MET   3.  Pt will be compliant with wearing foot up brace at all times during the day and compliant with night splint to maintain tissue flexibility in left ankle Baseline: 100% compliance 02/20/22 Goal status: goal met    LONG TERM GOALS: Target date:  05/20/2022   Pt will be able to ambulate for 30 min (3x/day) without any significant ankle pain. Baseline:  20 min with 3/10 pain in ankle (03/11/22) Goal status: progressing   2.  Pt will demo 2+/5 strength in Left ankle dorsiflexion to improve active dorsiflexion during swing phase of gait to improve safety and reduce fall Baseline: 0/5 (01/30/22)- see goal #3 Goal status: goal met   3.  Pt will demo 3+/5 strength in Left ankle dorsiflexion to improve active dorsiflexion during swing phase of gait to improve safety and reduce fall Baseline: 2+/5 (03/11/22) Goal status: Revised on 03/11/22, progressing  4.  Pt will demo 0 deg of L ankle DF AROM to improve ankle dorsiflexion during gait and prevent falls. Baseline: -15 deg L ankle DF AROM (03/11/22) Goal status: Revised on 03/11/22, progressing    ASSESSMENT:   CLINICAL IMPRESSION:  Today's skilled session continued to focus on ankle range of motion and strengthening with rest breaks taken as needed. The pt appears to be making steady progress and should benefit from continued PT to progress toward unmet goals.    OBJECTIVE IMPAIRMENTS Abnormal gait, decreased activity tolerance, decreased balance, decreased endurance, decreased mobility, decreased ROM, decreased strength, hypomobility, increased fascial restrictions, increased muscle spasms, impaired flexibility, impaired sensation, impaired tone, and pain.    ACTIVITY LIMITATIONS cleaning, driving, meal prep, laundry, and yard work.    PERSONAL FACTORS Time since onset of injury/illness/exacerbation are also affecting patient's functional outcome.      REHAB POTENTIAL: Good   CLINICAL DECISION MAKING: Stable/uncomplicated   EVALUATION COMPLEXITY: Low   PLAN: PT FREQUENCY: 1x/week   PT DURATION: 8 sessions (over 10 weeks)   PLANNED INTERVENTIONS: Therapeutic exercises, Therapeutic activity, Neuromuscular re-education, Balance training, Gait training, Patient/Family education, Joint  manipulation, Joint mobilization, Stair training, Orthotic/Fit training, Electrical stimulation, Cryotherapy, Moist heat, Ultrasound, and Manual therapy   PLAN FOR NEXT SESSION:   Continue with bioness, gastroc/soleus stretching, L hip/knee strengthening and anterior tib on left strengthening    Willow Ora, PTA, [facility] [address], [place], [place] [address] [phone] 04/08/22, 8:10 PM

## 2022-04-09 ENCOUNTER — Telehealth: Payer: Self-pay | Admitting: Clinical

## 2022-04-09 NOTE — Telephone Encounter (Signed)
Call regarding referrral; Left HIPPA-compliant message to call back Asher Muir from [facility] [employer] [facility] at  [phone] [facility] office).

## 2022-04-15 ENCOUNTER — Ambulatory Visit: Payer: Medicaid Other | Attending: Neurology

## 2022-04-15 DIAGNOSIS — R2689 Other abnormalities of gait and mobility: Secondary | ICD-10-CM | POA: Insufficient documentation

## 2022-04-15 DIAGNOSIS — M21372 Foot drop, left foot: Secondary | ICD-10-CM | POA: Insufficient documentation

## 2022-04-15 DIAGNOSIS — M6281 Muscle weakness (generalized): Secondary | ICD-10-CM | POA: Diagnosis present

## 2022-04-15 NOTE — Therapy (Signed)
OUTPATIENT PHYSICAL THERAPY TREATMENT NOTE  Patient Name: Crystal Boyd MRN: [id number] DOB:1981/03/13, 41 y.o., female Today's Date: 04/15/2022  PCP: Merryl Hacker, No REFERRING PROVIDER: Melvenia Beam, MD   PT End of Session - 04/15/22 0911     Visit Number 13    Number of Visits 17    Date for PT Re-Evaluation 05/20/22    Authorization Type [employer] Medicaid (27 v max) no autho required >62 yrs old; recert on 12/16/31 (1x week for 8 sessions)    PT Start Time 0850    PT Stop Time 0930    PT Time Calculation (min) 40 min    Activity Tolerance Patient tolerated treatment well    Behavior During Therapy [facility] for tasks assessed/performed                Past Medical History:  Diagnosis Date   Anemia    Gestational diabetes    Gestational diabetes    Heart murmur    Past Surgical History:  Procedure Laterality Date   HYSTEROSCOPY  2014   thinks had few months after miscarriage   POLYPECTOMY  2020   removed polyp in uterus-[facility]   TUBAL LIGATION N/A 12/31/2021   Procedure: POST PARTUM TUBAL LIGATION;  Surgeon: Gwynne Edinger, MD;  Location: [facility] LD ORS;  Service: Gynecology;  Laterality: N/A;   Patient Active Problem List   Diagnosis Date Noted   Left foot drop 01/02/2022   History of gestational diabetes 10/24/2021   Alpha thalassemia silent carrier 07/18/2021   Heart murmur     REFERRING DIAG: M21.372 (ICD-10-CM) - Left foot drop   THERAPY DIAG:  Muscle weakness (generalized)  Other abnormalities of gait and mobility  Foot drop, left  PERTINENT HISTORY: None   PRECAUTIONS: none   SUBJECTIVE: No new complaints. No falls or pain. Feels she can lift the foot up more. Also reports her sensation is improving as well.   PAIN:  Are you having pain? No   LE ROM:      Passive /Active Right 01/30/2022 Left 01/30/2022 Left 03/11/22  Hip flexion        Hip extension        Hip abduction        Hip adduction        Hip internal rotation        Hip external  rotation        Knee flexion        Knee extension        Ankle dorsiflexion 20 10 20/-15  Ankle plantarflexion WNL WNL    Ankle inversion WNL WNL    Ankle eversion WNL WNL     (Blank rows = not tested)   MMT:     MMT Right 01/30/2022 Left 01/30/2022 Left 03/11/22 Left 04/15/22  Hip flexion         Hip extension         Hip abduction         Hip adduction         Hip internal rotation         Hip external rotation         Knee flexion         Knee extension         Ankle dorsiflexion 5/5 0/5 2/5 4/5  Ankle plantarflexion 5/5 4/5 4/5 5/5  Ankle inversion 5/5 1/5 3+/5 4/5  Ankle eversion 5/5 3+/5 4/5 5/5  (Blank rows = not tested)  TODAY'S TREATMENT:  Toe walk: 20 feet, heel walk: 20 feet but unable to maintain toes up when 100% weight on L LE during stance phase Standing with hips against countertop: toe raises: 3 x 10 Negative heel raises: 2 x 10 at edge of the step Marching on trampoline with EC: 2' SLS on floor with 500g ball throw to rebounder: 3 x 10 throws R and L Modified CTSIB: 120/120 sec. With moderate sway in test 4 Treadmill fwd walking with 8% grade: 4' at 1.0 mph and 2 HHA Treadmill bwd walking with flat grade: 2' at 0.8 mph and 2 HHA Pt educated on not locking knees when she is standing to improve core control,  Pt educated on increasing speed as tolerated for walking on treadmill walking over time.        PATIENT EDUCATION: Education details: continue with current HEP Person educated: Patient Education method: Explanation  Education comprehension: verbalized understanding     HOME EXERCISE PROGRAM: Access Code [email or web address] Wall plank with elbows on wall: pt flexes one hip/knee to touch the knee to wall in front also using contralateral ankle plantarflexion to get the knee to touch: 10x R and L (added 03/11/22) Discussed performing tandem stance, feet together and SLS with EC to work on Astronomer session.       GOALS: Goals reviewed  with patient? Yes   SHORT TERM GOALS: Target date: 02/20/2022   Patient will be able to perform tandem balance for 20 sec with left leg behind to improve balance Baseline: 02/20/22- 30+ sec bil Goal status: goal met   2.  Pt will demo 20 deg of PROM of L ankle to improve ankle flexibility during gait Baseline: 02/20/22- 15 degrees, improved from 10 degrees, 20 deg passive ankle DF (03/11/22) Goal status: MET   3.  Pt will be compliant with wearing foot up brace at all times during the day and compliant with night splint to maintain tissue flexibility in left ankle Baseline: 100% compliance 02/20/22 Goal status: goal met    LONG TERM GOALS: Target date: 05/20/2022   Pt will be able to ambulate for 30 min (3x/day) without any significant ankle pain. Baseline:  20 min with 3/10 pain in ankle (03/11/22) Goal status: progressing   2.  Pt will demo 2+/5 strength in Left ankle dorsiflexion to improve active dorsiflexion during swing phase of gait to improve safety and reduce fall Baseline: 0/5 (01/30/22)- see goal #3 Goal status: goal met   3.  Pt will demo 3+/5 strength in Left ankle dorsiflexion to improve active dorsiflexion during swing phase of gait to improve safety and reduce fall Baseline: 2+/5 (03/11/22); 4/5 (04/15/22) Goal status: Revised on 03/11/22, goal met on 04/15/22  4.  Pt will demo 0 deg of L ankle DF AROM to improve ankle dorsiflexion during gait and prevent falls. Baseline: -15 deg L ankle DF AROM (03/11/22) Goal status: Revised on 03/11/22, progressing    ASSESSMENT:   CLINICAL IMPRESSION:  Today's skilled session focused on continued strengthening, stretching and rehab of L ankle. Pt demonstrates difficulty with balance on non compliant surfaces with EC as she has more sway. Pt has demonstrated improved ankle strength compared to last reassessment and has met LTG#3. Pt has demonstrated improved strength overall.   OBJECTIVE IMPAIRMENTS Abnormal gait, decreased activity tolerance,  decreased balance, decreased endurance, decreased mobility, decreased ROM, decreased strength, hypomobility, increased fascial restrictions, increased muscle spasms, impaired flexibility, impaired sensation, impaired tone, and pain.    ACTIVITY LIMITATIONS  cleaning, driving, meal prep, laundry, and yard work.    PERSONAL FACTORS Time since onset of injury/illness/exacerbation are also affecting patient's functional outcome.      REHAB POTENTIAL: Good   CLINICAL DECISION MAKING: Stable/uncomplicated   EVALUATION COMPLEXITY: Low   PLAN: PT FREQUENCY: 1x/week   PT DURATION: 8 sessions (over 10 weeks)   PLANNED INTERVENTIONS: Therapeutic exercises, Therapeutic activity, Neuromuscular re-education, Balance training, Gait training, Patient/Family education, Joint manipulation, Joint mobilization, Stair training, Orthotic/Fit training, Electrical stimulation, Cryotherapy, Moist heat, Ultrasound, and Manual therapy   PLAN FOR NEXT SESSION:   We discussed plan of discharge in next 3-4 sessions.    Kerrie Pleasure, PT 04/15/2022, 9:35 AM

## 2022-04-22 ENCOUNTER — Encounter: Payer: Self-pay | Admitting: Physical Therapy

## 2022-04-22 ENCOUNTER — Ambulatory Visit: Payer: Medicaid Other | Admitting: Physical Therapy

## 2022-04-22 DIAGNOSIS — R2689 Other abnormalities of gait and mobility: Secondary | ICD-10-CM

## 2022-04-22 DIAGNOSIS — M21372 Foot drop, left foot: Secondary | ICD-10-CM

## 2022-04-22 DIAGNOSIS — M6281 Muscle weakness (generalized): Secondary | ICD-10-CM | POA: Diagnosis not present

## 2022-04-22 NOTE — Therapy (Signed)
OUTPATIENT PHYSICAL THERAPY TREATMENT NOTE  Patient Name: Crystal Boyd MRN: [id number] DOB:1981-07-05, 41 y.o., female Today's Date: 04/22/2022  PCP: Merryl Hacker, No REFERRING PROVIDER: Melvenia Beam, MD   PT End of Session - 04/22/22 0852     Visit Number 14    Number of Visits 17    Date for PT Re-Evaluation 05/20/22    Authorization Type [employer] Medicaid (27 v max) no autho required >22 yrs old; recert on 07/16/97 (1x week for 8 sessions)    PT Start Time [phone]    PT Stop Time 0930    PT Time Calculation (min) 39 min    Activity Tolerance Patient tolerated treatment well    Behavior During Therapy [facility] for tasks assessed/performed                Past Medical History:  Diagnosis Date   Anemia    Gestational diabetes    Gestational diabetes    Heart murmur    Past Surgical History:  Procedure Laterality Date   HYSTEROSCOPY  2014   thinks had few months after miscarriage   POLYPECTOMY  2020   removed polyp in uterus-[facility]   TUBAL LIGATION N/A 12/31/2021   Procedure: POST PARTUM TUBAL LIGATION;  Surgeon: Gwynne Edinger, MD;  Location: [facility] LD [facility]S;  Service: Gynecology;  Laterality: N/A;   Patient Active Problem List   Diagnosis Date Noted   Left foot drop 01/02/2022   History of gestational diabetes 10/24/2021   Alpha thalassemia silent carrier 07/18/2021   Heart murmur     REFERRING DIAG: M21.372 (ICD-10-CM) - Left foot drop   THERAPY DIAG:  Muscle weakness (generalized)  Other abnormalities of gait and mobility  Foot drop, left  PERTINENT HISTORY: None   PRECAUTIONS: none   SUBJECTIVE: No new complaints. No falls or pain. Reports intermittent hip pain.  PAIN:  Are you having pain? Yes NPRS scale: 7/10 Pain location: left hip Pain orientation: Left  PAIN TYPE: acute Pain description: intermittent, sharp, and stabbing  Aggravating factors: immobility Relieving factors: movement, tylenol       TODAY'S TREATMENT:   [facility] Adult PT  Treatment/Exercise - 04/22/22 0001       Electrical Stimulation   Electrical Stimulation Location left anterior tibialis    Electrical Stimulation Action for increased strengthening and muscle/nerve activation in both open and closed chain    Electrical Stimulation Parameters refer tablet 1 for adjusted parameters; quick fit electrodes    Electrical Stimulation Goals Strength;Neuromuscular facilitation            STRENGTHENING : concurrent with Bioness Elliptical level 1.0  x 3 minutes each forward/backward with bil UE support  BALANCE/NMR: concurrent with Bioness Side Stepping: on wooden balance beam left<>right with light UE support- heel up for 5 laps toward each side, then with toes up for 5 laps toward each side. Min guard assist for safety.  Rockerboard: in anterior/posterior direction: alternating heel taps to floor with on time/resting on board with rest time for 10 reps each side. Then with both feet on board- assisted DF with on time/rest in neutral with off times for ~5 minutes. Then with feet apart (just beyond hip width apart) for mini squats with on time/rest with off time x 10 reps.     SELF CARE: Discussed sleeping positions and stretches to try for decreased hip pain. Pt to try these at home.         PATIENT EDUCATION: Education details: continue with current HEP; sleeping  positions/stretches to address hip pain Person educated: Patient Education method: Explanation  Education comprehension: verbalized understanding     HOME EXERCISE PROGRAM: Access Code [email or web address] Wall plank with elbows on wall: pt flexes one hip/knee to touch the knee to wall in front also using contralateral ankle plantarflexion to get the knee to touch: 10x R and L (added 03/11/22) Discussed performing tandem stance, feet together and SLS with EC to work on Teacher, English as a foreign language session.       GOALS: Goals reviewed with patient? Yes   SHORT TERM GOALS: Target date: 02/20/2022    Patient will be able to perform tandem balance for 20 sec with left leg behind to improve balance Baseline: 02/20/22- 30+ sec bil Goal status: goal met   2.  Pt will demo 20 deg of PROM of L ankle to improve ankle flexibility during gait Baseline: 02/20/22- 15 degrees, improved from 10 degrees, 20 deg passive ankle DF (03/11/22) Goal status: MET   3.  Pt will be compliant with wearing foot up brace at all times during the day and compliant with night splint to maintain tissue flexibility in left ankle Baseline: 100% compliance 02/20/22 Goal status: goal met    LONG TERM GOALS: Target date: 05/20/2022   Pt will be able to ambulate for 30 min (3x/day) without any significant ankle pain. Baseline:  20 min with 3/10 pain in ankle (03/11/22) Goal status: progressing   2.  Pt will demo 2+/5 strength in Left ankle dorsiflexion to improve active dorsiflexion during swing phase of gait to improve safety and reduce fall Baseline: 0/5 (01/30/22)- see goal #3 Goal status: goal met   3.  Pt will demo 3+/5 strength in Left ankle dorsiflexion to improve active dorsiflexion during swing phase of gait to improve safety and reduce fall Baseline: 2+/5 (03/11/22); 4/5 (04/15/22) Goal status: Revised on 03/11/22, goal met on 04/15/22  4.  Pt will demo 0 deg of L ankle DF AROM to improve ankle dorsiflexion during gait and prevent falls. Baseline: -15 deg L ankle DF AROM (03/11/22) Goal status: Revised on 03/11/22, progressing    ASSESSMENT:   CLINICAL IMPRESSION:  Today's skilled session focused on continued to focus on use of Bioness with exercises for strengthening while addressing balance training as well. No issues noted or reported in session.    OBJECTIVE IMPAIRMENTS Abnormal gait, decreased activity tolerance, decreased balance, decreased endurance, decreased mobility, decreased ROM, decreased strength, hypomobility, increased fascial restrictions, increased muscle spasms, impaired flexibility, impaired  sensation, impaired tone, and pain.    ACTIVITY LIMITATIONS cleaning, driving, meal prep, laundry, and yard work.    PERSONAL FACTORS Time since onset of injury/illness/exacerbation are also affecting patient's functional outcome.      REHAB POTENTIAL: Good   CLINICAL DECISION MAKING: Stable/uncomplicated   EVALUATION COMPLEXITY: Low   PLAN: PT FREQUENCY: 1x/week   PT DURATION: 8 sessions (over 10 weeks)   PLANNED INTERVENTIONS: Therapeutic exercises, Therapeutic activity, Neuromuscular re-education, Balance training, Gait training, Patient/Family education, Joint manipulation, Joint mobilization, Stair training, Orthotic/Fit training, Electrical stimulation, Cryotherapy, Moist heat, Ultrasound, and Manual therapy   PLAN FOR NEXT SESSION:   Begin to finalize HEP/community fitness and goals for anticipated discharge in next 2 visits    Willow Ora, PTA, [place] [address], [place], [place] [address] [phone] 04/22/22, 12:41 PM

## 2022-04-29 ENCOUNTER — Ambulatory Visit: Payer: Medicaid Other | Admitting: Physical Therapy

## 2022-04-29 ENCOUNTER — Encounter: Payer: Self-pay | Admitting: Physical Therapy

## 2022-04-29 DIAGNOSIS — R2689 Other abnormalities of gait and mobility: Secondary | ICD-10-CM

## 2022-04-29 DIAGNOSIS — M21372 Foot drop, left foot: Secondary | ICD-10-CM

## 2022-04-29 DIAGNOSIS — M6281 Muscle weakness (generalized): Secondary | ICD-10-CM | POA: Diagnosis not present

## 2022-04-29 NOTE — Therapy (Signed)
OUTPATIENT PHYSICAL THERAPY TREATMENT NOTE  Patient Name: Crystal Boyd MRN: [id number] DOB:18-Mar-1981, 41 y.o., female Today's Date: 04/29/2022  PCP: Merryl Hacker, No REFERRING PROVIDER: Melvenia Beam, MD   PT End of Session - 04/29/22 0929     Visit Number 15    Number of Visits 17    Date for PT Re-Evaluation 05/20/22    Authorization Type [employer] Medicaid (27 v max) no autho required >11 yrs old; recert on 03/15/25 (1x week for 8 sessions)    PT Start Time 0928    PT Stop Time 1012    PT Time Calculation (min) 44 min    Activity Tolerance Patient tolerated treatment well    Behavior During Therapy [facility] for tasks assessed/performed                Past Medical History:  Diagnosis Date   Anemia    Gestational diabetes    Gestational diabetes    Heart murmur    Past Surgical History:  Procedure Laterality Date   HYSTEROSCOPY  2014   thinks had few months after miscarriage   POLYPECTOMY  2020   removed polyp in uterus-[facility]   TUBAL LIGATION N/A 12/31/2021   Procedure: POST PARTUM TUBAL LIGATION;  Surgeon: Gwynne Edinger, MD;  Location: [facility] LD [facility];  Service: Gynecology;  Laterality: N/A;   Patient Active Problem List   Diagnosis Date Noted   Left foot drop 01/02/2022   History of gestational diabetes 10/24/2021   Alpha thalassemia silent carrier 07/18/2021   Heart murmur     REFERRING DIAG: M21.372 (ICD-10-CM) - Left foot drop   THERAPY DIAG:  Muscle weakness (generalized)  Other abnormalities of gait and mobility  Foot drop, left  PERTINENT HISTORY: None   PRECAUTIONS: none   SUBJECTIVE: No new complaints. Has full sensation back in entire foot/leg. Hip pain is getting better, use of pillow with sleeping is helping.   PAIN:  Are you having pain? Yes NPRS scale: 2-3/10 Pain location: left hip Pain orientation: Left  PAIN TYPE: acute Pain description: intermittent, sharp, and stabbing  Aggravating factors: immobility Relieving factors: movement,  tylenol       TODAY'S TREATMENT:  STRENGTHENING :  Elliptical level 1.0  x 3 minutes each forward/backward with bil UE support Blue band: resisted ankle DF x 15 reps Red band: resisted ankle inversion, then eversion x 15 reps each  BALANCE/NMR:  On inverted BOSU: feet hip width apart rocking anterior/posterior x 15 reps, then rocking laterally x 15 reps each way. Cues on posture and weight shifting to assist with balance. Then had pt perform mini squats x 15 reps with emphasis on equal weight bearing. BOSU blue side up: alternating step ups with contralateral march for 10 reps each side with no UE support/light touch; then standing on it with bil feet hip width apart: lateral stepping off to floor<>back on, alternating sides for 10 reps each side. On blue foam beam: side stepping left<>right in squat position for 4 laps toward each side with cues on step length/technique and occasional touch to bars. Min guard assist for safety. Then tandem walking  forward/backward with heel taps to floor each step for 4 laps each way, light touch to bars at times, min guard assist for safety.         PATIENT EDUCATION: Education details: continue with current HEP; plan to discharge at next session Person educated: Patient Education method: Explanation  Education comprehension: verbalized understanding     HOME EXERCISE  PROGRAM: Access Code [email or web address] Wall plank with elbows on wall: pt flexes one hip/knee to touch the knee to wall in front also using contralateral ankle plantarflexion to get the knee to touch: 10x R and L (added 03/11/22) Discussed performing tandem stance, feet together and SLS with EC to work on Teacher, English as a foreign language session.       GOALS: Goals reviewed with patient? Yes   SHORT TERM GOALS: Target date: 02/20/2022   Patient will be able to perform tandem balance for 20 sec with left leg behind to improve balance Baseline: 02/20/22- 30+ sec bil Goal status: goal met   2.   Pt will demo 20 deg of PROM of L ankle to improve ankle flexibility during gait Baseline: 02/20/22- 15 degrees, improved from 10 degrees, 20 deg passive ankle DF (03/11/22) Goal status: MET   3.  Pt will be compliant with wearing foot up brace at all times during the day and compliant with night splint to maintain tissue flexibility in left ankle Baseline: 100% compliance 02/20/22 Goal status: goal met    LONG TERM GOALS: Target date: 05/20/2022   Pt will be able to ambulate for 30 min (3x/day) without any significant ankle pain. Baseline:  20 min with 3/10 pain in ankle (03/11/22) Goal status: progressing   2.  Pt will demo 2+/5 strength in Left ankle dorsiflexion to improve active dorsiflexion during swing phase of gait to improve safety and reduce fall Baseline: 0/5 (01/30/22)- see goal #3 Goal status: goal met   3.  Pt will demo 3+/5 strength in Left ankle dorsiflexion to improve active dorsiflexion during swing phase of gait to improve safety and reduce fall Baseline: 2+/5 (03/11/22); 4/5 (04/15/22) Goal status: Revised on 03/11/22, goal met on 04/15/22  4.  Pt will demo 0 deg of L ankle DF AROM to improve ankle dorsiflexion during gait and prevent falls. Baseline: -15 deg L ankle DF AROM (03/11/22) Goal status: Revised on 03/11/22, progressing    ASSESSMENT:   CLINICAL IMPRESSION:  Today's skilled session focused on continued to focus on strengthening and balance training with no issues noted or reported in session. The pt is making progress toward goals. Plan to check LTGs at next session.   OBJECTIVE IMPAIRMENTS Abnormal gait, decreased activity tolerance, decreased balance, decreased endurance, decreased mobility, decreased ROM, decreased strength, hypomobility, increased fascial restrictions, increased muscle spasms, impaired flexibility, impaired sensation, impaired tone, and pain.    ACTIVITY LIMITATIONS cleaning, driving, meal prep, laundry, and yard work.    PERSONAL FACTORS Time  since onset of injury/illness/exacerbation are also affecting patient's functional outcome.      REHAB POTENTIAL: Good   CLINICAL DECISION MAKING: Stable/uncomplicated   EVALUATION COMPLEXITY: Low   PLAN: PT FREQUENCY: 1x/week   PT DURATION: 8 sessions (over 10 weeks)   PLANNED INTERVENTIONS: Therapeutic exercises, Therapeutic activity, Neuromuscular re-education, Balance training, Gait training, Patient/Family education, Joint manipulation, Joint mobilization, Stair training, Orthotic/Fit training, Electrical stimulation, Cryotherapy, Moist heat, Ultrasound, and Manual therapy   PLAN FOR NEXT SESSION:   Discharge next visit. Try jogging and climbing uneven surfaces    Willow Ora, [place], [facility] [address] [place], [place] [address] [phone] 04/29/22, 12:02 PM

## 2022-05-06 ENCOUNTER — Ambulatory Visit: Payer: Medicaid Other | Admitting: Physical Therapy

## 2022-05-06 ENCOUNTER — Encounter: Payer: Self-pay | Admitting: Physical Therapy

## 2022-05-06 DIAGNOSIS — M21372 Foot drop, left foot: Secondary | ICD-10-CM

## 2022-05-06 DIAGNOSIS — R2689 Other abnormalities of gait and mobility: Secondary | ICD-10-CM

## 2022-05-06 DIAGNOSIS — M6281 Muscle weakness (generalized): Secondary | ICD-10-CM

## 2022-05-06 NOTE — Therapy (Signed)
OUTPATIENT PHYSICAL THERAPY TREATMENT NOTE  Patient Name: Crystal Boyd MRN: [id number] DOB:December 19, 1980, 41 y.o., female Today's Date: 05/06/2022  PCP: Merryl Hacker, No REFERRING PROVIDER: Melvenia Beam, MD   PT End of Session - 05/06/22 0850     Visit Number 16    Number of Visits 17    Date for PT Re-Evaluation 05/20/22    Authorization Type [employer] Medicaid (27 v max) no autho required >81 yrs old; recert on 02/11/80 (1x week for 8 sessions)    PT Start Time 0849    PT Stop Time 0930    PT Time Calculation (min) 41 min    Activity Tolerance Patient tolerated treatment well    Behavior During Therapy [facility] for tasks assessed/performed                Past Medical History:  Diagnosis Date   Anemia    Gestational diabetes    Gestational diabetes    Heart murmur    Past Surgical History:  Procedure Laterality Date   HYSTEROSCOPY  2014   thinks had few months after miscarriage   POLYPECTOMY  2020   removed polyp in uterus-[facility]   TUBAL LIGATION N/A 12/31/2021   Procedure: POST PARTUM TUBAL LIGATION;  Surgeon: Gwynne Edinger, MD;  Location: [facility] LD ORS;  Service: Gynecology;  Laterality: N/A;   Patient Active Problem List   Diagnosis Date Noted   Left foot drop 01/02/2022   History of gestational diabetes 10/24/2021   Alpha thalassemia silent carrier 07/18/2021   Heart murmur     REFERRING DIAG: M21.372 (ICD-10-CM) - Left foot drop   THERAPY DIAG:  Muscle weakness (generalized)  Other abnormalities of gait and mobility  Foot drop, left  PERTINENT HISTORY: None   PRECAUTIONS: none   SUBJECTIVE: No new complaints. Getting "pulses" at times in anterior tib area.   PAIN:  Are you having pain? No     TODAY'S TREATMENT:  STRENGTHENING :  Elliptical level 1.0  x 3 minutes each forward/backward with bil UE support  AROM: left ankle DF 12 degrees  GAIT: Gait pattern: step through pattern Distance walked: >/= 1000 feet indoors/outdoors Assistive device  utilized: None Level of assistance: SBA Comments: gait outdoors over uneven terrain, including up/down steep grassy hills by [place] with no balance issues noted; along 50 foot hallway- jogging x 6 laps with supervision. Pt noted to stay on toes throughout with no heel>toe step progression\.   NMR: Pre jogging: use of strips of theraband spaced on floor with pt fast walking one foot between end working on heel strike>toe off. Pt able to perform for several laps in hallway with supervision and working on increased speed with each lap.       PATIENT EDUCATION: Education details: continue with current HEP Person educated: Patient Education method: Explanation  Education comprehension: verbalized understanding     HOME EXERCISE PROGRAM: Access Code [email or web address] Wall plank with elbows on wall: pt flexes one hip/knee to touch the knee to wall in front also using contralateral ankle plantarflexion to get the knee to touch: 10x R and L (added 03/11/22) Discussed performing tandem stance, feet together and SLS with EC to work on Teacher, English as a foreign language session.       GOALS: Goals reviewed with patient? Yes   SHORT TERM GOALS: Target date: 02/20/2022   Patient will be able to perform tandem balance for 20 sec with left leg behind to improve balance Baseline: 02/20/22- 30+ sec bil Goal status: goal met  2.  Pt will demo 20 deg of PROM of L ankle to improve ankle flexibility during gait Baseline: 02/20/22- 15 degrees, improved from 10 degrees, 20 deg passive ankle DF (03/11/22) Goal status: MET   3.  Pt will be compliant with wearing foot up brace at all times during the day and compliant with night splint to maintain tissue flexibility in left ankle Baseline: 100% compliance 02/20/22 Goal status: goal met    LONG TERM GOALS: Target date: 05/20/2022   Pt will be able to ambulate for 30 min (3x/day) without any significant ankle pain. Baseline:  05/06/2022: met per pt report Goal status:  MET   2.  Pt will demo 2+/5 strength in Left ankle dorsiflexion to improve active dorsiflexion during swing phase of gait to improve safety and reduce fall Baseline: 0/5 (01/30/22)- see goal #3 Goal status: MET   3.  Pt will demo 3+/5 strength in Left ankle dorsiflexion to improve active dorsiflexion during swing phase of gait to improve safety and reduce fall Baseline: 2+/5 (03/11/22); 4/5 (04/15/22) Goal status: Revised on 03/11/22, MET on 04/15/22  4.  Pt will demo 0 deg of L ankle DF AROM to improve ankle dorsiflexion during gait and prevent falls. Baseline: -15 deg L ankle DF AROM (03/11/22); 05/06/22 12 degrees AROM Goal status: MET    ASSESSMENT:   CLINICAL IMPRESSION:  Today's skilled session focused on gait/balance on steep uneven terrain as pt plans to return to hiking and pre-jogging/jogging activities with no issues reported. Provided pt with bands to use for a pre jog activity at home so to help her stop staying on her toes with jogging. Pt has met all remaining LTGs. She had 1 visit left for finalization of an HEP for home.    OBJECTIVE IMPAIRMENTS Abnormal gait, decreased activity tolerance, decreased balance, decreased endurance, decreased mobility, decreased ROM, decreased strength, hypomobility, increased fascial restrictions, increased muscle spasms, impaired flexibility, impaired sensation, impaired tone, and pain.    ACTIVITY LIMITATIONS cleaning, driving, meal prep, laundry, and yard work.    PERSONAL FACTORS Time since onset of injury/illness/exacerbation are also affecting patient's functional outcome.      REHAB POTENTIAL: Good   CLINICAL DECISION MAKING: Stable/uncomplicated   EVALUATION COMPLEXITY: Low   PLAN: PT FREQUENCY: 1x/week   PT DURATION: 8 sessions (over 10 weeks)   PLANNED INTERVENTIONS: Therapeutic exercises, Therapeutic activity, Neuromuscular re-education, Balance training, Gait training, Patient/Family education, Joint manipulation, Joint  mobilization, Stair training, Orthotic/Fit training, Electrical stimulation, Cryotherapy, Moist heat, Ultrasound, and Manual therapy   PLAN FOR NEXT SESSION:   Finalize HEP and discharge    Willow Ora, PTA, [place] [address] [place], [place] [address] [phone] 05/06/22, 10:36 AM

## 2022-05-13 ENCOUNTER — Ambulatory Visit: Payer: Medicaid Other

## 2022-05-15 ENCOUNTER — Ambulatory Visit: Payer: Medicaid Other | Attending: Neurology

## 2022-05-15 DIAGNOSIS — M21372 Foot drop, left foot: Secondary | ICD-10-CM | POA: Insufficient documentation

## 2022-05-15 DIAGNOSIS — R2689 Other abnormalities of gait and mobility: Secondary | ICD-10-CM | POA: Insufficient documentation

## 2022-05-15 DIAGNOSIS — M6281 Muscle weakness (generalized): Secondary | ICD-10-CM | POA: Diagnosis present

## 2022-05-15 NOTE — Therapy (Signed)
OUTPATIENT PHYSICAL THERAPY Discharge Note  Patient Name: Crystal Boyd MRN: [id number] DOB:08/15/1981, 41 y.o., female Today's Date: 05/15/2022  PCP: Merryl Hacker, No REFERRING PROVIDER: Melvenia Beam, MD   PT End of Session - 05/15/22 0855     Visit Number 17    Number of Visits 17    Date for PT Re-Evaluation 05/20/22    Authorization Type [employer] Medicaid (27 v max) no autho required >92 yrs old; recert on 02/09/67 (1x week for 8 sessions)    PT Start Time 0850    PT Stop Time 0920    PT Time Calculation (min) 30 min    Activity Tolerance Patient tolerated treatment well    Behavior During Therapy [facility] for tasks assessed/performed                Past Medical History:  Diagnosis Date   Anemia    Gestational diabetes    Gestational diabetes    Heart murmur    Past Surgical History:  Procedure Laterality Date   HYSTEROSCOPY  2014   thinks had few months after miscarriage   POLYPECTOMY  2020   removed polyp in uterus-[facility]   TUBAL LIGATION N/A 12/31/2021   Procedure: POST PARTUM TUBAL LIGATION;  Surgeon: Gwynne Edinger, MD;  Location: [facility] LD ORS;  Service: Gynecology;  Laterality: N/A;   Patient Active Problem List   Diagnosis Date Noted   Left foot drop 01/02/2022   History of gestational diabetes 10/24/2021   Alpha thalassemia silent carrier 07/18/2021   Heart murmur     REFERRING DIAG: M21.372 (ICD-10-CM) - Left foot drop   THERAPY DIAG:  Muscle weakness (generalized)  Other abnormalities of gait and mobility  Foot drop, left  PERTINENT HISTORY: None   PRECAUTIONS: none   SUBJECTIVE: No new complaints. Getting "pulses" at times in anterior tib area.   PAIN:  Are you having pain? No     TODAY'S TREATMENT:  LE ROM:      Passive /Active Right 01/30/2022 Left 01/30/2022 Left 03/11/22 Left 05/15/22  Hip flexion         Hip extension         Hip abduction         Hip adduction         Hip internal rotation         Hip external rotation          Knee flexion         Knee extension         Ankle dorsiflexion 20 10 20/-15 20/15  Ankle plantarflexion WNL WNL     Ankle inversion WNL WNL     Ankle eversion WNL WNL      (Blank rows = not tested)   MMT:     MMT Right 01/30/2022 Left 01/30/2022 Left 03/11/22 Left 04/15/22 Left 05/15/22  Hip flexion           Hip extension           Hip abduction           Hip adduction           Hip internal rotation           Hip external rotation           Knee flexion           Knee extension           Ankle dorsiflexion 5/5 0/5 2/5 4/5 5/5  Ankle plantarflexion 5/5  4/5 4/5 5/5 5/5  Ankle inversion 5/5 1/5 3+/5 4/5 5/5  Ankle eversion 5/5 3+/5 4/5 5/5 5/5  (Blank rows = not tested)     Reassessment perfomred Calf stretching on stairs: 5 x 30" L only Observed patient running: cues for toe jogging, pt educated on starting to jog for 5 min 3-4x/week and then progressing by adding 2-5 min per week until she reaches her desired amount of running. Reviewed unilateral heel raises, unilateral calf stretching (20sec before running and 1 min long after stretching for 3-5 reps) and jogging lightly 3-4x/week.       PATIENT EDUCATION: Education details: continue with current HEP Person educated: Patient Education method: Explanation  Education comprehension: verbalized understanding     HOME EXERCISE PROGRAM: Access Code [email or web address] Wall plank with elbows on wall: pt flexes one hip/knee to touch the knee to wall in front also using contralateral ankle plantarflexion to get the knee to touch: 10x R and L (added 03/11/22) Discussed performing tandem stance, feet together and SLS with EC to work on Teacher, English as a foreign language session.       GOALS: Goals reviewed with patient? Yes   SHORT TERM GOALS: Target date: 02/20/2022   Patient will be able to perform tandem balance for 20 sec with left leg behind to improve balance Baseline: 02/20/22- 30+ sec bil Goal status: goal met   2.  Pt will demo 20  deg of PROM of L ankle to improve ankle flexibility during gait Baseline: 02/20/22- 15 degrees, improved from 10 degrees, 20 deg passive ankle DF (03/11/22) Goal status: MET   3.  Pt will be compliant with wearing foot up brace at all times during the day and compliant with night splint to maintain tissue flexibility in left ankle Baseline: 100% compliance 02/20/22 Goal status: goal met    LONG TERM GOALS: Target date: 05/20/2022   Pt will be able to ambulate for 30 min (3x/day) without any significant ankle pain. Baseline:  05/06/2022: met per pt report Goal status: MET   2.  Pt will demo 2+/5 strength in Left ankle dorsiflexion to improve active dorsiflexion during swing phase of gait to improve safety and reduce fall Baseline: 0/5 (01/30/22)- see goal #3 Goal status: MET   3.  Pt will demo 3+/5 strength in Left ankle dorsiflexion to improve active dorsiflexion during swing phase of gait to improve safety and reduce fall Baseline: 2+/5 (03/11/22); 4/5 (04/15/22) Goal status: Revised on 03/11/22, MET on 04/15/22  4.  Pt will demo 0 deg of L ankle DF AROM to improve ankle dorsiflexion during gait and prevent falls. Baseline: -15 deg L ankle DF AROM (03/11/22); 05/06/22 12 degrees AROM Goal status: MET    ASSESSMENT:   CLINICAL IMPRESSION:  Pt has been seen for total of 17 sessions from 01/30/22 to 05/15/22 for left foot drop. Pt has demonstrated significant improvement in her L ankle AROM and strength. Currently she has regained 5/5 strength in her L ankle and currently demonstrating 5 deg of limitation to full AROM of L ankle dorsiflexion which she should be able to regain with continuous stretching with home exercise program. She has met all of her functional short term and long term goals and will be discharged from independent home exercise program.    OBJECTIVE IMPAIRMENTS Abnormal gait, decreased activity tolerance, decreased balance, decreased endurance, decreased mobility, decreased ROM,  decreased strength, hypomobility, increased fascial restrictions, increased muscle spasms, impaired flexibility, impaired sensation, impaired tone, and pain.    ACTIVITY LIMITATIONS  cleaning, driving, meal prep, laundry, and yard work.    PERSONAL FACTORS Time since onset of injury/illness/exacerbation are also affecting patient's functional outcome.      REHAB POTENTIAL: Good   CLINICAL DECISION MAKING: Stable/uncomplicated   EVALUATION COMPLEXITY: Low   PLAN: Discharge from [place], PT 05/15/2022, 9:29 AM

## 2023-02-08 IMAGING — US US OB COMP LESS 14 WK
1 series · 15 of 28 positions shown · non-contrast
Comparison: None.

CLINICAL DATA: Vaginal bleeding.

EXAM:
OBSTETRIC <14 WK ULTRASOUND
TECHNIQUE: Transabdominal ultrasound was performed for evaluation of the
gestation as well as the maternal uterus and adnexal regions.

[Series 1: us ob comp less 14 wk · 15 of 42 slices shown]
[im 1/42]
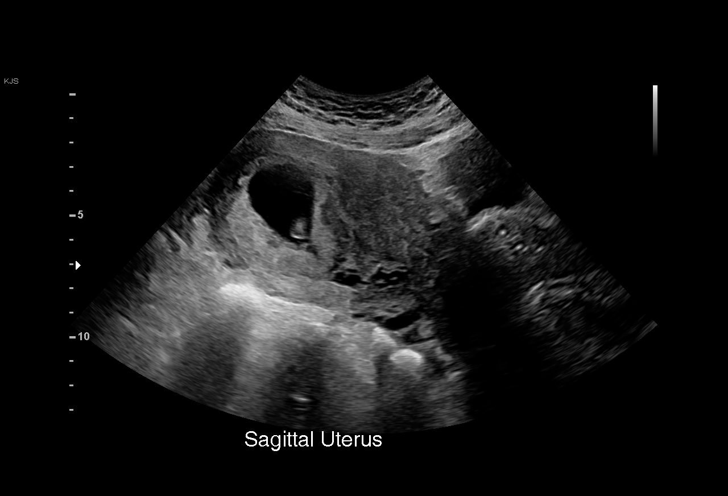
[im 4/42]
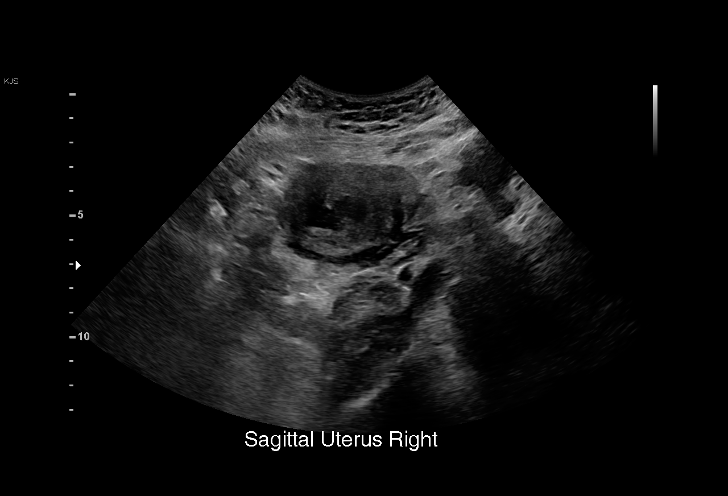
[im 7/42]
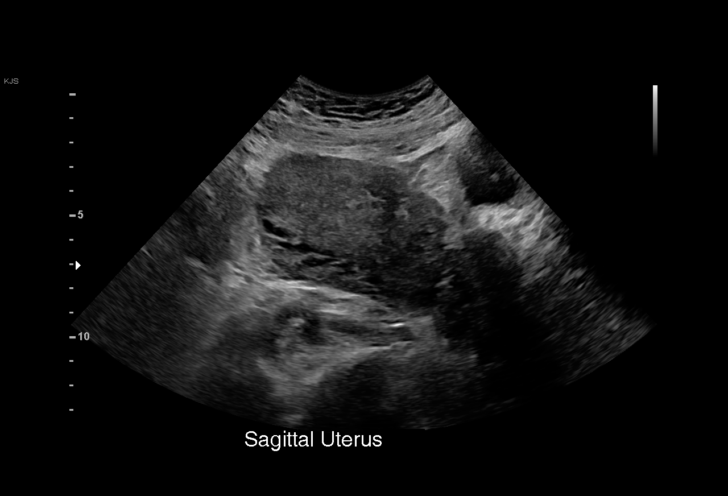
[im 10/42]
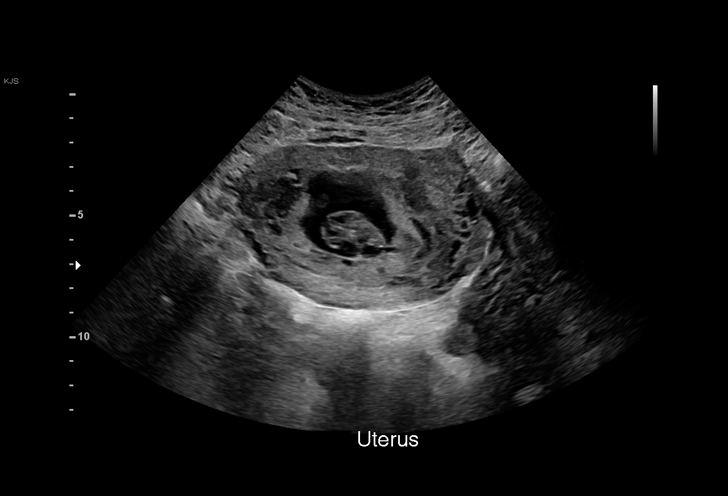
[im 13/42]
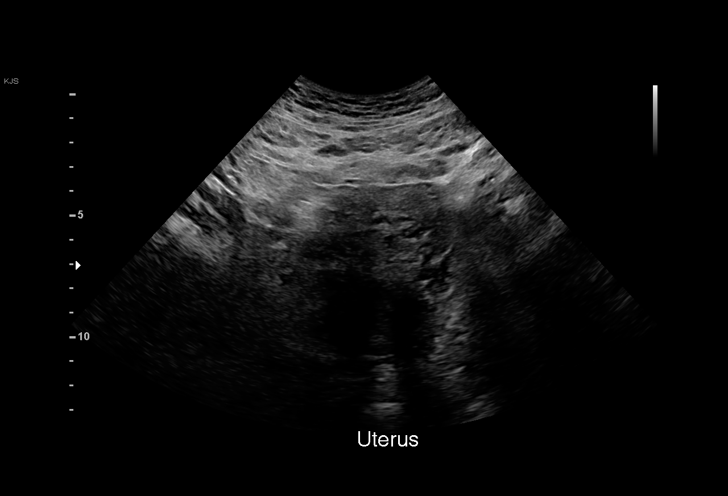
[im 16/42]
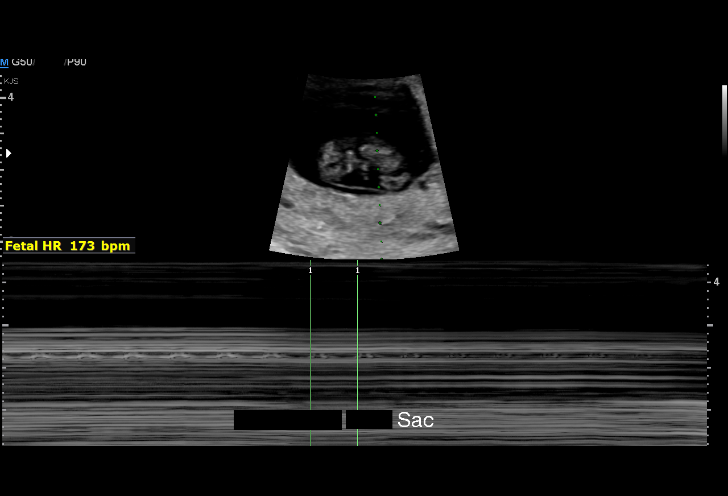
[im 19/42]
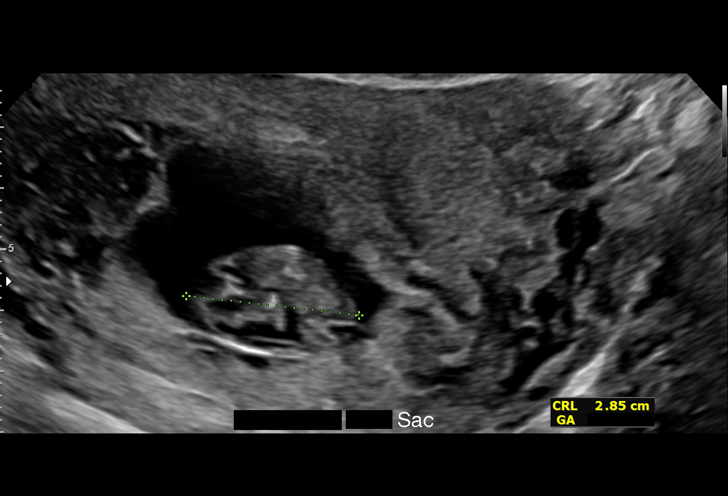
[im 22/42]
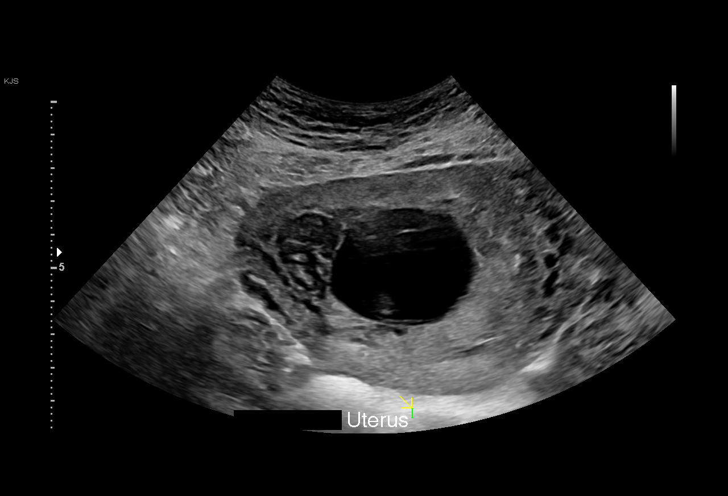
[im 23/42]
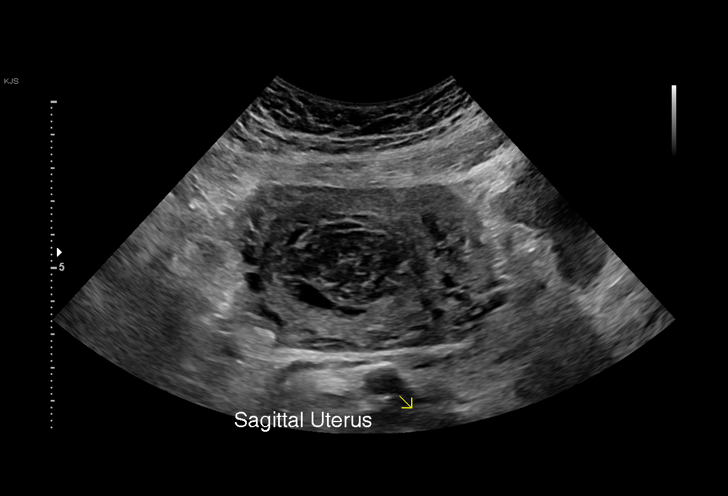
[im 26/42]
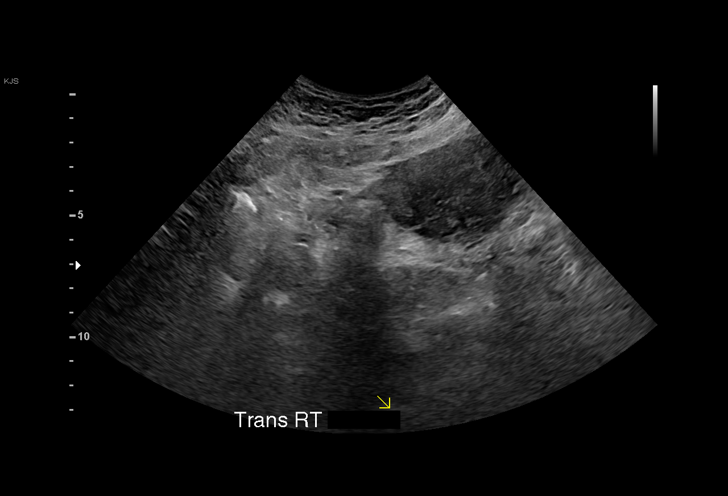
[im 29/42]
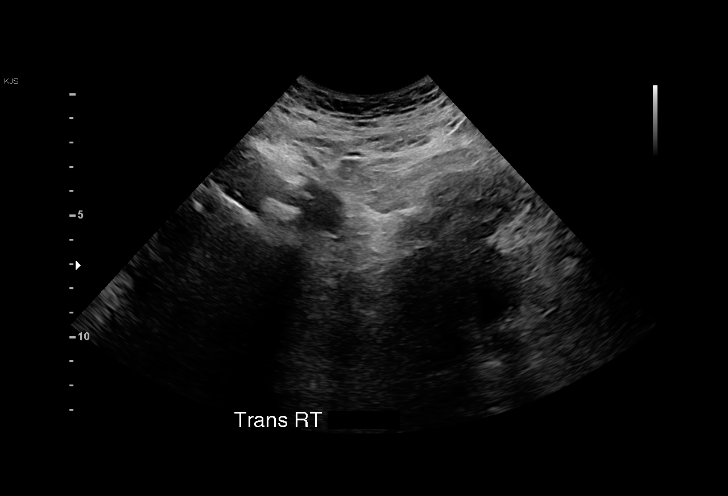
[im 32/42]
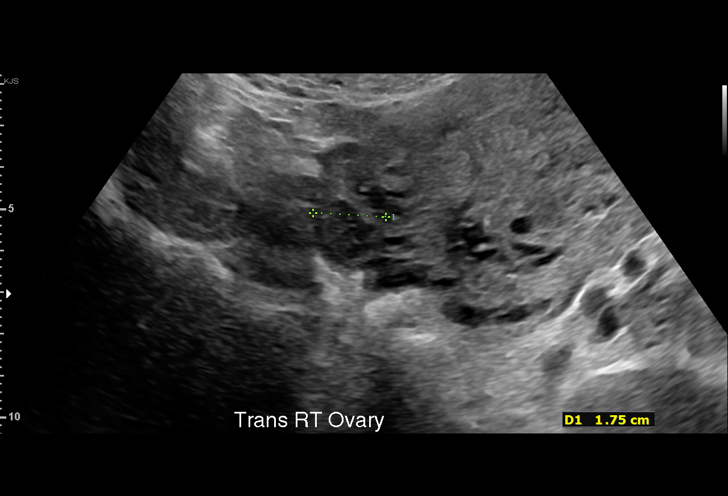
[im 35/42]
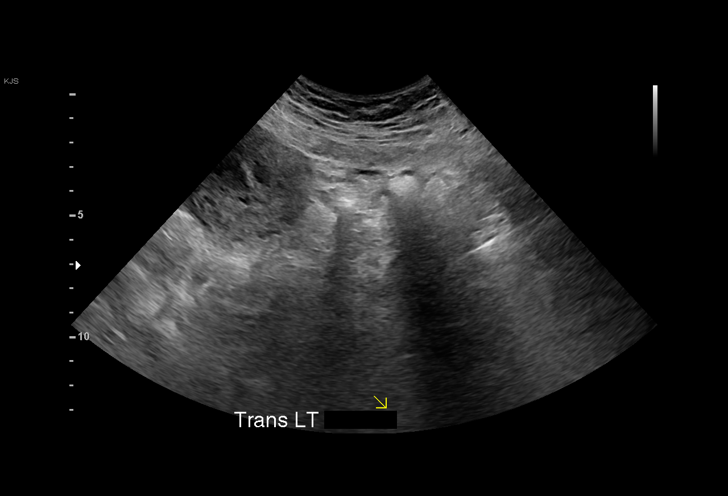
[im 38/42]
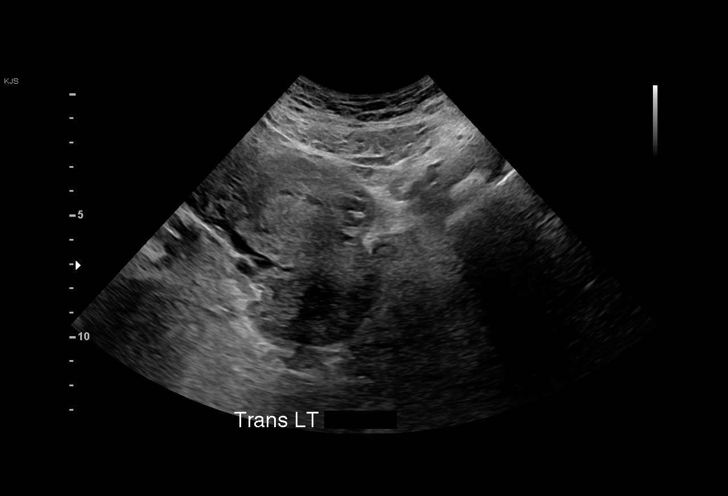
[im 42/42]
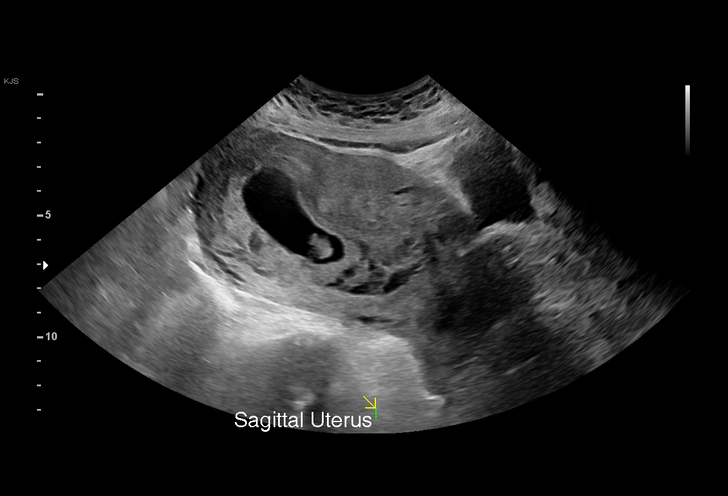

[15 of 28 positions shown; findings below may reference images not displayed]

FINDINGS: Intrauterine gestational sac: Single

Yolk sac:  Visualized.

Embryo:  Visualized.

Cardiac Activity: Visualized.

Heart Rate: 173 bpm

CRL:   28.9 mm   9 w 4 d                  US EDC: January 03, 2022

Subchorionic hemorrhage: Moderate in size, to the right of the
gestational sac.

Maternal uterus/adnexae: A corpus luteum cyst is seen within an
otherwise normal appearing right ovary.

The left ovary is visualized and is normal in appearance.

No pelvic free fluid is identified.
IMPRESSION: 1. Single, viable intrauterine pregnancy at approximately 9 weeks
and 4 days gestation by ultrasound evaluation.
2. Moderate sized subchorionic hemorrhage.

## 2023-09-29 IMAGING — MR MR LUMBAR SPINE WO/W CM
4 of 7 series · 18 of 48 positions shown · IV contrast (Yes GAD)
Comparison: None.

CLINICAL DATA: Lumbar radiculopathy, no red flags, no prior
management has foot drop after epidural

EXAM:
MRI LUMBAR SPINE WITHOUT AND WITH CONTRAST
TECHNIQUE: Multiplanar and multiecho pulse sequences of the lumbar spine were
obtained without and with intravenous contrast.
CONTRAST:  6.5mL GADAVIST GADOBUTROL 1 MMOL/ML IV SOLN

[Series 2: T2 · sagittal · 4.0mm · 0.55mm/px · 4 of 15 slices shown (1 of 2)]
[im 1/15]
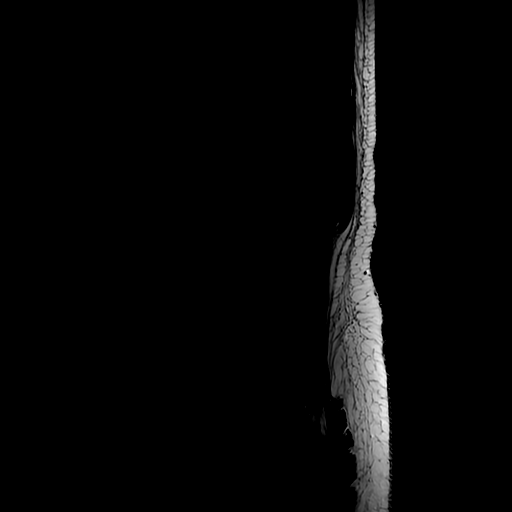
[im 5/15]
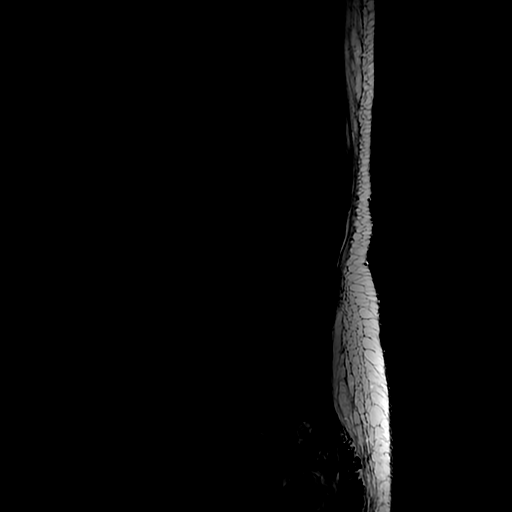
[im 10/15]
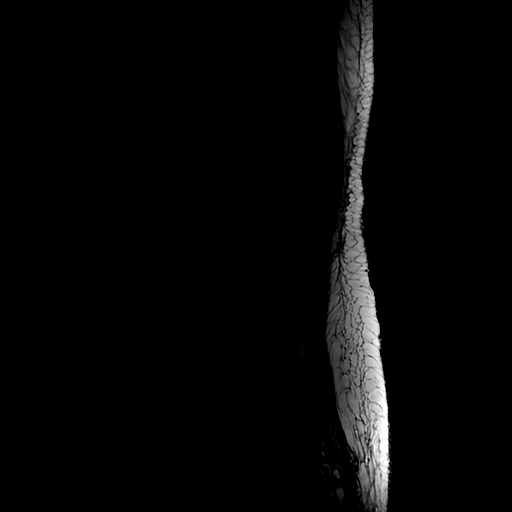
[im 15/15]
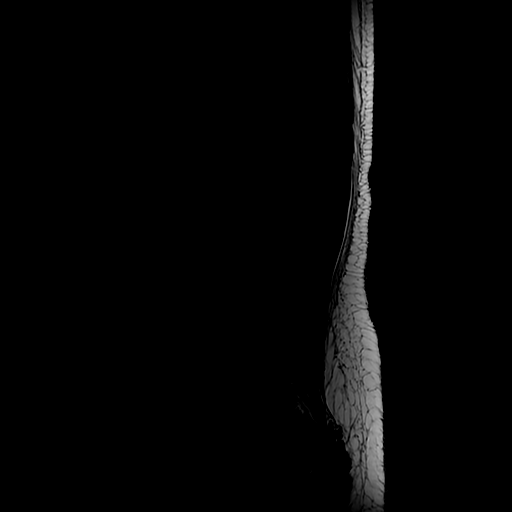

[Series 4: T1 · sagittal · 4.0mm · 0.55mm/px · 3 of 15 slices shown (1 of 2)]
[im 1/15]
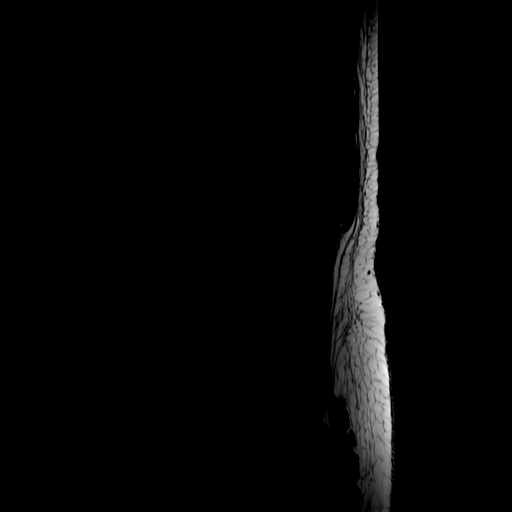
[im 10/15]
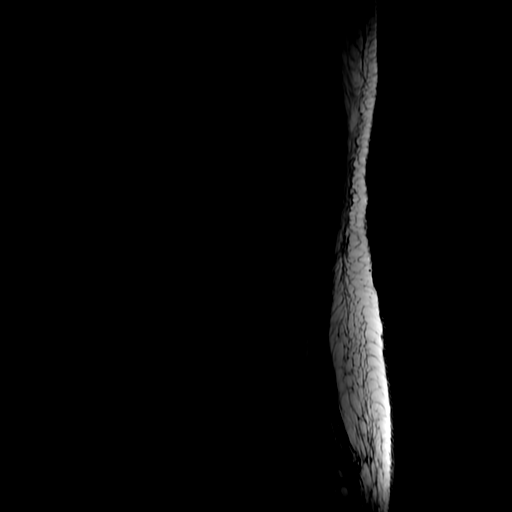
[im 15/15]
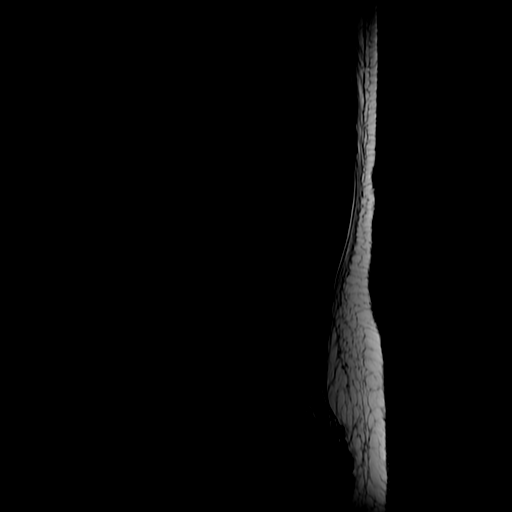

[Series 5: T2 · axial · 4.0mm · 0.39mm/px · z∈[-110,+79]mm · 8 of 39 slices shown (2 of 2)]
[im 1/39]
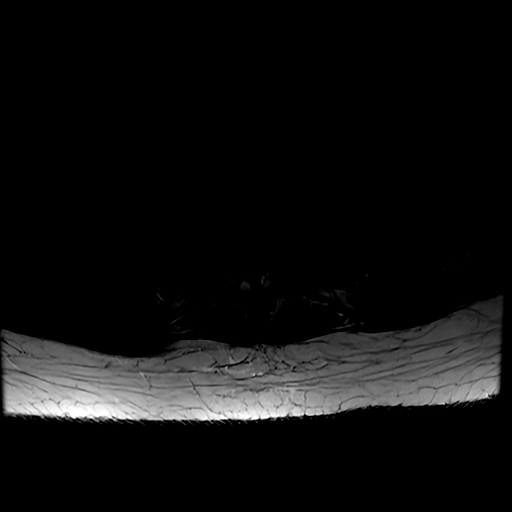
[im 4/39]
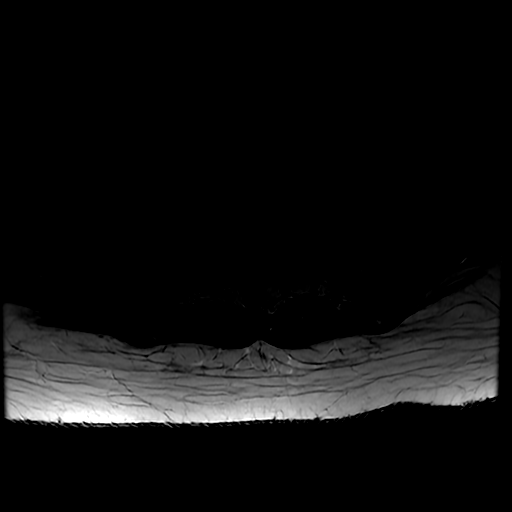
[im 8/39]
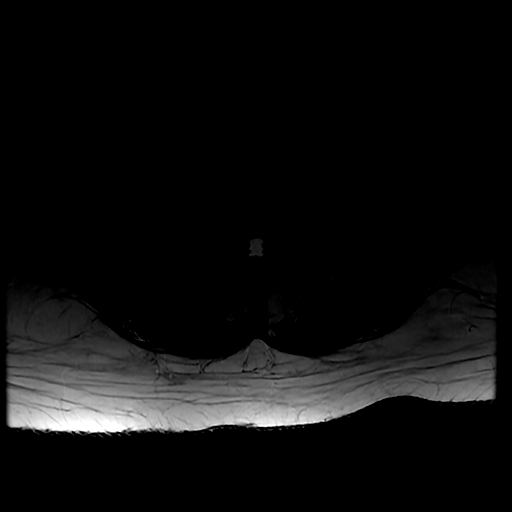
[im 12/39]
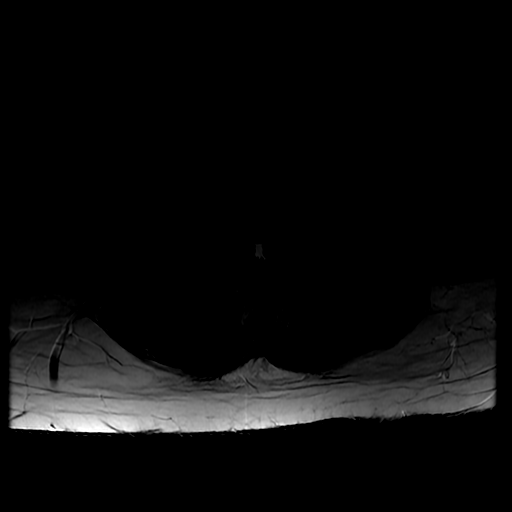
[im 16/39]
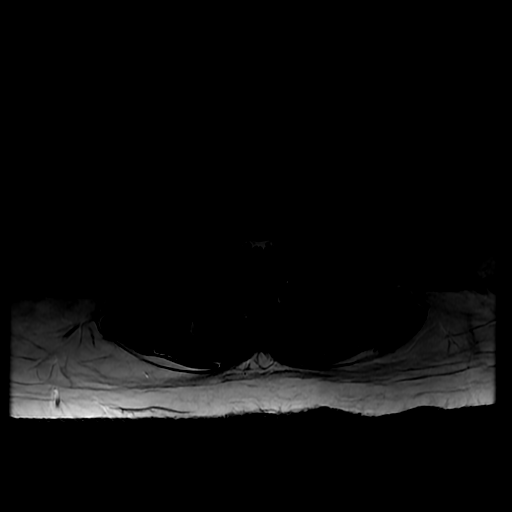
[im 20/39]
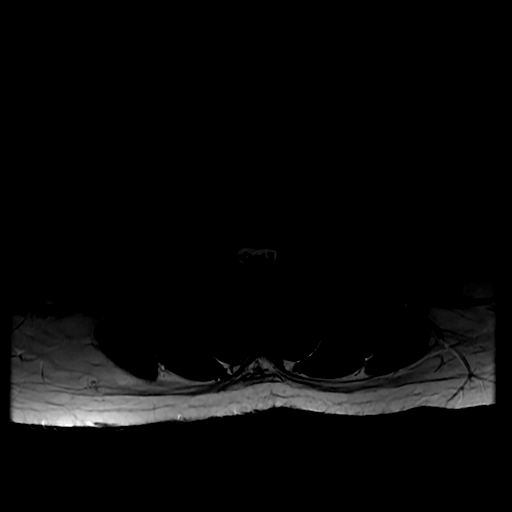
[im 23/39]
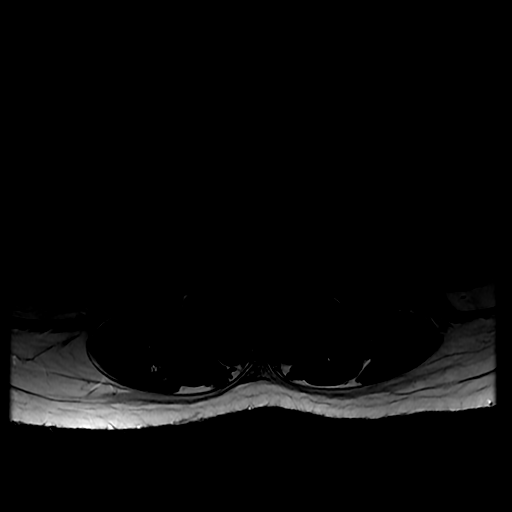
[im 35/39]
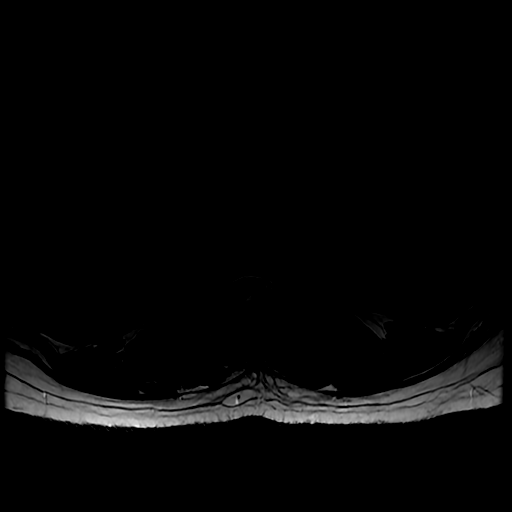

[Series 6: T1 · axial · 4.0mm · 0.39mm/px · z∈[-95,+79]mm · 3 of 39 slices shown (2 of 2)]
[im 4/39]
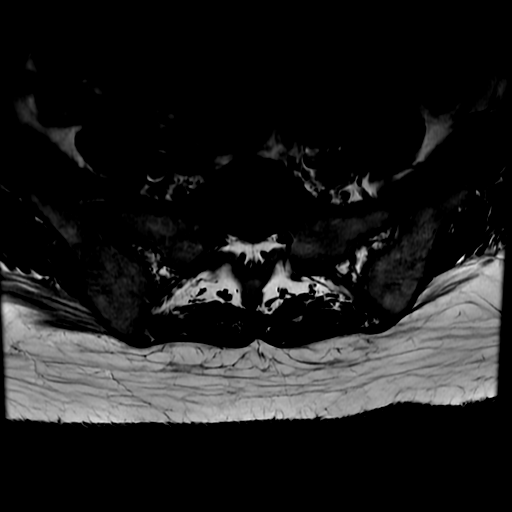
[im 20/39]
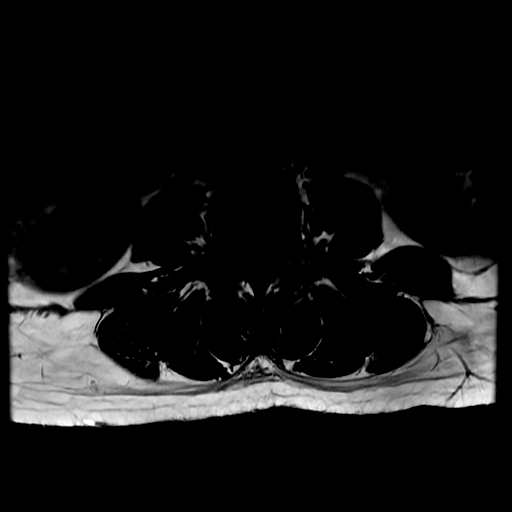
[im 35/39]
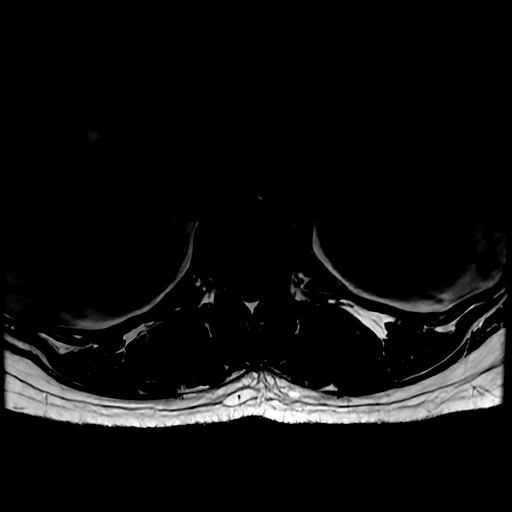

[18 of 48 positions shown; findings below may reference images not displayed]

FINDINGS: Segmentation:  Standard.

Alignment:  Physiologic.

Vertebrae: No fracture, evidence of discitis, or bone lesion.
Diffuse intrinsic canal narrowing on the basis of congenitally short
pedicles.

Conus medullaris and cauda equina: Conus extends to the L1 level.
Conus and cauda equina appear normal. No fluid collection or other
abnormality within the epidural space.

Paraspinal and other soft tissues: Negative.

Disc levels:

T12-L1: No disc protrusion. Unremarkable facet joints. Mild canal
narrowing on the basis of congenitally short pedicles. No canal
stenosis.

L1-L2: No disc protrusion. Unremarkable facet joints. Mild canal
narrowing on the basis of congenitally short pedicles. No canal
stenosis.

L2-L3: No disc protrusion. Mild bilateral facet hypertrophy. Mild
canal narrowing on the basis of congenitally short pedicles. No
canal stenosis.

L3-L4: No disc protrusion. Mild bilateral facet hypertrophy. Mild
canal narrowing on the basis of congenitally short pedicles. No
canal stenosis.

L4-L5: No disc protrusion. Mild bilateral facet hypertrophy. Mild
canal narrowing on the basis of congenitally short pedicles. No
canal stenosis.

L5-S1: No disc protrusion. Mild bilateral facet hypertrophy. Mild
canal narrowing on the basis of congenitally short pedicles. No
canal stenosis.
IMPRESSION: 1. No epidural fluid collection or other abnormality within the
epidural space.
2. Mild canal narrowing throughout the lumbar spine on the basis of
congenitally short pedicles. Mild facet hypertrophy bilaterally at
the L2-3 through L5-S1 levels without significant foraminal
stenosis.
